# Patient Record
Sex: Female | Born: 1950 | Race: White | Hispanic: No | Marital: Married | Smoking: Never smoker
Health system: Southern US, Community
[De-identification: ages and names within clinical notes are randomized; demographics above are authoritative.]

## PROBLEM LIST (undated history)

## (undated) DIAGNOSIS — I10 Essential (primary) hypertension: Secondary | ICD-10-CM

## (undated) DIAGNOSIS — E7800 Pure hypercholesterolemia, unspecified: Secondary | ICD-10-CM

## (undated) DIAGNOSIS — G8929 Other chronic pain: Secondary | ICD-10-CM

## (undated) DIAGNOSIS — G629 Polyneuropathy, unspecified: Secondary | ICD-10-CM

## (undated) DIAGNOSIS — E119 Type 2 diabetes mellitus without complications: Secondary | ICD-10-CM

## (undated) HISTORY — PX: DILATION AND CURETTAGE OF UTERUS: SHX78

## (undated) HISTORY — PX: GALLBLADDER SURGERY: SHX652

## (undated) HISTORY — PX: CHOLECYSTECTOMY: SHX55

## (undated) HISTORY — PX: COLONOSCOPY: SHX174

---

## 2007-12-28 ENCOUNTER — Other Ambulatory Visit: Payer: Self-pay

## 2007-12-28 ENCOUNTER — Ambulatory Visit: Payer: Self-pay | Admitting: Ophthalmology

## 2009-07-18 ENCOUNTER — Ambulatory Visit: Payer: Self-pay | Admitting: Ophthalmology

## 2013-03-24 DIAGNOSIS — I152 Hypertension secondary to endocrine disorders: Secondary | ICD-10-CM | POA: Insufficient documentation

## 2013-03-24 DIAGNOSIS — E1169 Type 2 diabetes mellitus with other specified complication: Secondary | ICD-10-CM | POA: Insufficient documentation

## 2013-09-09 DIAGNOSIS — E1139 Type 2 diabetes mellitus with other diabetic ophthalmic complication: Secondary | ICD-10-CM | POA: Insufficient documentation

## 2020-02-23 ENCOUNTER — Other Ambulatory Visit: Payer: Self-pay | Admitting: Acute Care

## 2020-02-23 DIAGNOSIS — M549 Dorsalgia, unspecified: Secondary | ICD-10-CM

## 2020-03-08 ENCOUNTER — Other Ambulatory Visit: Payer: Self-pay

## 2020-03-08 ENCOUNTER — Ambulatory Visit
Admission: RE | Admit: 2020-03-08 | Discharge: 2020-03-08 | Disposition: A | Discharge status: Home / Self Care | Payer: Medicare Other | Source: Ambulatory Visit | Attending: Acute Care | Admitting: Acute Care

## 2020-03-08 DIAGNOSIS — M549 Dorsalgia, unspecified: Secondary | ICD-10-CM | POA: Diagnosis present

## 2020-05-29 DIAGNOSIS — G8929 Other chronic pain: Secondary | ICD-10-CM | POA: Insufficient documentation

## 2020-07-03 ENCOUNTER — Other Ambulatory Visit: Payer: Self-pay | Admitting: Acute Care

## 2020-07-03 DIAGNOSIS — M5416 Radiculopathy, lumbar region: Secondary | ICD-10-CM

## 2020-07-19 ENCOUNTER — Other Ambulatory Visit: Payer: Self-pay

## 2020-07-19 ENCOUNTER — Ambulatory Visit
Admission: RE | Admit: 2020-07-19 | Discharge: 2020-07-19 | Disposition: A | Discharge status: Home / Self Care | Payer: Medicare Other | Source: Ambulatory Visit | Attending: Acute Care | Admitting: Acute Care

## 2020-07-19 DIAGNOSIS — M5416 Radiculopathy, lumbar region: Secondary | ICD-10-CM | POA: Insufficient documentation

## 2020-12-27 NOTE — Patient Instructions (Incomplete)
VESTIBULAR AND BALANCE EVALUATION   HISTORY:  Subjective history of current problem: Description of dizziness: (vertigo, unsteadiness, lightheadedness, falling, general unsteadiness, whoozy, swimmy-headed sensation, aural fullness) Frequency:  Duration: Symptom nature: (motion provoked, positional, spontaneous, constant, variable, intermittent)   Provocative Factors: Easing Factors:  Progression of symptoms: (better, worse, no change since onset) History of similar episodes:  Falls (yes/no): Number of falls in past 6 months:   Prior Functional Level:   Auditory complaints (tinnitus, pain, drainage, hearing loss, aural fullness): Vision (diplopia, visual field loss, recent changes, last eye exam):  Red Flags: (dysarthria, dysphagia, drop attacks, bowel and bladder changes, recent weight loss/gain) Review of systems negative for red flags.     EXAMINATION  POSTURE:   NEUROLOGICAL SCREEN: (2+ unless otherwise noted.) N=normal  Ab=abnormal  Level Dermatome R L Myotome R L Reflex R L  C3 Anterior Neck N N Sidebend C2-3 N N Jaw CN V    C4 Top of Shoulder N N Shoulder Shrug C4 N N Hoffman's UMN    C5 Lateral Upper Arm N N Shoulder ABD C4-5 N N Biceps C5-6    C6 Lateral Arm/ Thumb N N Arm Flex/ Wrist Ext C5-6 N N Brachiorad. C5-6    C7 Middle Finger N N Arm Ext//Wrist Flex C6-7 N N Triceps C7    C8 4th & 5th Finger N N Flex/ Ext Carpi Ulnaris C8 N N Patellar (L3-4)    T1 Medial Arm N N Interossei T1 N N Gastrocnemius    L2 Medial thigh/groin N N Illiopsoas (L2-3) N N     L3 Lower thigh/med.knee N N Quadriceps (L3-4) N N     L4 Medial leg/lat thigh N N Tibialis Ant (L4-5) N N     L5 Lat. leg & dorsal foot N N EHL (L5) N N     S1 post/lat foot/thigh/leg N N Gastrocnemius (S1-2) N N     S2 Post./med. thigh & leg N N Hamstrings (L4-S3) N N       Cranial Nerves Visual acuity and visual fields are intact  Extraocular muscles are intact  Facial sensation is intact bilaterally   Facial strength is intact bilaterally  Hearing is normal as tested by gross conversation Palate elevates midline, normal phonation  Shoulder shrug strength is intact  Tongue protrudes midline    SOMATOSENSORY:         Sensation           Intact      Diminished         Absent  Light touch       COORDINATION: Finger to Nose: Normal Heel to Shin: Normal Pronator Drift: Negative Rapid Alternating Movements: Normal Finger to Thumb Opposition: Normal  MUSCULOSKELETAL SCREEN: Cervical Spine ROM: WFL and painless in all planes. No gross deficits identified   ROM:   MMT:   Functional Mobility:  Gait: Scanning of visual environment with gait is:    POSTURAL CONTROL TESTS:   Clinical Test of Sensory Interaction for Balance    (CTSIB):  CONDITION TIME STRATEGY SWAY  Eyes open, firm surface 30 seconds ankle   Eyes closed, firm surface 30 seconds ankle   Eyes open, foam surface 30 seconds ankle   Eyes closed, foam surface 30 seconds ankle     OCULOMOTOR / VESTIBULAR TESTING:  Oculomotor Exam- Room Light  Findings Comments  Ocular Alignment {normal/abnormal/not examined:14677}   Ocular ROM {normal/abnormal/not examined:14677}   Spontaneous Nystagmus {normal/abnormal/not examined:14677}   Gaze-Holding Nystagmus {normal/abnormal/not examined:14677}     End-Gaze Nystagmus {normal/abnormal/not examined:14677}   Vergence (normal 2-3") {normal/abnormal/not examined:14677}   Smooth Pursuit {normal/abnormal/not examined:14677}   Cross-Cover Test {normal/abnormal/not examined:14677}   Saccades {normal/abnormal/not examined:14677}   VOR Cancellation {normal/abnormal/not examined:14677}   Left Head Impulse {normal/abnormal/not examined:14677}   Right Head Impulse {normal/abnormal/not examined:14677}   Static Acuity {normal/abnormal/not examined:14677}   Dynamic Acuity {normal/abnormal/not examined:14677}     Oculomotor Exam- Fixation Suppressed  Findings Comments  Ocular  Alignment {normal/abnormal/not examined:14677}   Spontaneous Nystagmus {normal/abnormal/not examined:14677}   Gaze-Holding Nystagmus {normal/abnormal/not examined:14677}   End-Gaze Nystagmus {normal/abnormal/not examined:14677}   Head Shaking Nystagmus {normal/abnormal/not examined:14677}   Pressure-Induced Nystagmus {normal/abnormal/not examined:14677}   Hyperventilation Induced Nystagmus {normal/abnormal/not examined:14677}   Skull Vibration Induced Nystagmus {normal/abnormal/not examined:14677}     BPPV TESTS:  Symptoms Duration Intensity Nystagmus  L Dix-Hallpike None   None  R Dix-Hallpike None   None  L Head Roll None   None  R Head Roll None   None  L Sidelying Test      R Sidelying Test        FUNCTIONAL OUTCOME MEASURES   Results Comments  BERG /56 Fall risk, in need of intervention  DGI /24   FGA /30   TUG seconds   5TSTS seconds   6 Minute Walk Test    10 Meter Gait Speed Self-selected: s = m/s; Fastest: s = m/s Below normative values for full community ambulation  ABC Scale %   DHI /100     ASSESSMENT Clinical Impression: Pt is a pleasant year-old female/female referred for difficulty with baalance. PT examination reveals deficits . Pt presents with deficits in strength, gait and balance. Pt will benefit from skilled PT services to address deficits in balance and decrease risk for future falls.   Low (stable): no personal factors/comorbidities, 1-2 body systems/activity limitations/participation restrictions   Moderate (evolving): 1-2 personal factors/comorbidities, 3 or more body systems/activity limitations/participation restrictions   High (unstable): 3 or more personal factors/comorbidities, 4 or more body systems/activity limitations/participation restrictions    PLAN Next Visit: HEP:    Pt will be independent with HEP in order to improve strength and balance in order to decrease fall risk and improve function at home and work.   Pt will improve BERG by  at least 3 points in order to demonstrate clinically significant improvement in balance.    Pt will improve DGI by at least 3 points in order to demonstrate clinically significant improvement in balance and decreased risk for falls.  Pt will improve ABC by at least 13% in order to demonstrate clinically significant improvement in balance confidence.   Pt will decrease 5TSTS by at least 3 seconds in order to demonstrate clinically significant improvement in LE strength.  Pt will decrease TUG to below 14 seconds/decrease in order to demonstrate decreased fall risk.  Pt will decrease DHI score by at least 18 points in order to demonstrate clinically significant reduction in disability   Pt will increase 6MWT by at least 50m (164ft) in order to demonstrate clinically significant improvement in cardiopulmonary endurance and community ambulation                

## 2021-01-01 ENCOUNTER — Ambulatory Visit: Payer: Medicare Other

## 2021-01-08 ENCOUNTER — Ambulatory Visit: Payer: Medicare Other

## 2021-01-14 NOTE — Patient Instructions (Signed)
VESTIBULAR AND BALANCE EVALUATION   HISTORY:  Subjective history of current problem: Description of dizziness: (vertigo, unsteadiness, lightheadedness, falling, general unsteadiness, whoozy, swimmy-headed sensation, aural fullness) Frequency:  Duration: Symptom nature: (motion provoked, positional, spontaneous, constant, variable, intermittent)   Provocative Factors: Easing Factors:  Progression of symptoms: (better, worse, no change since onset) History of similar episodes:  Falls (yes/no): Number of falls in past 6 months:   Prior Functional Level:   Auditory complaints (tinnitus, pain, drainage, hearing loss, aural fullness): Vision (diplopia, visual field loss, recent changes, last eye exam):  Red Flags: (dysarthria, dysphagia, drop attacks, bowel and bladder changes, recent weight loss/gain) Review of systems negative for red flags.     EXAMINATION  POSTURE:   NEUROLOGICAL SCREEN: (2+ unless otherwise noted.) N=normal  Ab=abnormal  Level Dermatome R L Myotome R L Reflex R L  C3 Anterior Neck N N Sidebend C2-3 N N Jaw CN V    C4 Top of Shoulder N N Shoulder Shrug C4 N N Hoffman's UMN    C5 Lateral Upper Arm N N Shoulder ABD C4-5 N N Biceps C5-6    C6 Lateral Arm/ Thumb N N Arm Flex/ Wrist Ext C5-6 N N Brachiorad. C5-6    C7 Middle Finger N N Arm Ext//Wrist Flex C6-7 N N Triceps C7    C8 4th & 5th Finger N N Flex/ Ext Carpi Ulnaris C8 N N Patellar (L3-4)    T1 Medial Arm N N Interossei T1 N N Gastrocnemius    L2 Medial thigh/groin N N Illiopsoas (L2-3) N N     L3 Lower thigh/med.knee N N Quadriceps (L3-4) N N     L4 Medial leg/lat thigh N N Tibialis Ant (L4-5) N N     L5 Lat. leg & dorsal foot N N EHL (L5) N N     S1 post/lat foot/thigh/leg N N Gastrocnemius (S1-2) N N     S2 Post./med. thigh & leg N N Hamstrings (L4-S3) N N       Cranial Nerves Visual acuity and visual fields are intact  Extraocular muscles are intact  Facial sensation is intact bilaterally   Facial strength is intact bilaterally  Hearing is normal as tested by gross conversation Palate elevates midline, normal phonation  Shoulder shrug strength is intact  Tongue protrudes midline    SOMATOSENSORY:         Sensation           Intact      Diminished         Absent  Light touch       COORDINATION: Finger to Nose: Normal Heel to Shin: Normal Pronator Drift: Negative Rapid Alternating Movements: Normal Finger to Thumb Opposition: Normal  MUSCULOSKELETAL SCREEN: Cervical Spine ROM: WFL and painless in all planes. No gross deficits identified   ROM:   MMT:   Functional Mobility:  Gait: Scanning of visual environment with gait is:    POSTURAL CONTROL TESTS:   Clinical Test of Sensory Interaction for Balance    (CTSIB):  CONDITION TIME STRATEGY SWAY  Eyes open, firm surface 30 seconds ankle   Eyes closed, firm surface 30 seconds ankle   Eyes open, foam surface 30 seconds ankle   Eyes closed, foam surface 30 seconds ankle     OCULOMOTOR / VESTIBULAR TESTING:  Oculomotor Exam- Room Light  Findings Comments  Ocular Alignment {normal/abnormal/not examined:14677}   Ocular ROM {normal/abnormal/not examined:14677}   Spontaneous Nystagmus {normal/abnormal/not examined:14677}   Gaze-Holding Nystagmus {normal/abnormal/not examined:14677}     End-Gaze Nystagmus {normal/abnormal/not examined:14677}   Vergence (normal 2-3") {normal/abnormal/not examined:14677}   Smooth Pursuit {normal/abnormal/not examined:14677}   Cross-Cover Test {normal/abnormal/not examined:14677}   Saccades {normal/abnormal/not examined:14677}   VOR Cancellation {normal/abnormal/not examined:14677}   Left Head Impulse {normal/abnormal/not examined:14677}   Right Head Impulse {normal/abnormal/not examined:14677}   Static Acuity {normal/abnormal/not examined:14677}   Dynamic Acuity {normal/abnormal/not examined:14677}     Oculomotor Exam- Fixation Suppressed  Findings Comments  Ocular  Alignment {normal/abnormal/not examined:14677}   Spontaneous Nystagmus {normal/abnormal/not examined:14677}   Gaze-Holding Nystagmus {normal/abnormal/not examined:14677}   End-Gaze Nystagmus {normal/abnormal/not examined:14677}   Head Shaking Nystagmus {normal/abnormal/not examined:14677}   Pressure-Induced Nystagmus {normal/abnormal/not examined:14677}   Hyperventilation Induced Nystagmus {normal/abnormal/not examined:14677}   Skull Vibration Induced Nystagmus {normal/abnormal/not examined:14677}     BPPV TESTS:  Symptoms Duration Intensity Nystagmus  L Dix-Hallpike None   None  R Dix-Hallpike None   None  L Head Roll None   None  R Head Roll None   None  L Sidelying Test      R Sidelying Test        FUNCTIONAL OUTCOME MEASURES   Results Comments  BERG /56 Fall risk, in need of intervention  DGI /24   FGA /30   TUG seconds   5TSTS seconds   6 Minute Walk Test    10 Meter Gait Speed Self-selected: s = m/s; Fastest: s = m/s Below normative values for full community ambulation  ABC Scale %   DHI /100     ASSESSMENT Clinical Impression: Pt is a pleasant year-old female/female referred for difficulty with baalance. PT examination reveals deficits . Pt presents with deficits in strength, gait and balance. Pt will benefit from skilled PT services to address deficits in balance and decrease risk for future falls.   Low (stable): no personal factors/comorbidities, 1-2 body systems/activity limitations/participation restrictions   Moderate (evolving): 1-2 personal factors/comorbidities, 3 or more body systems/activity limitations/participation restrictions   High (unstable): 3 or more personal factors/comorbidities, 4 or more body systems/activity limitations/participation restrictions    PLAN Next Visit: HEP:    Pt will be independent with HEP in order to improve strength and balance in order to decrease fall risk and improve function at home and work.   Pt will improve BERG by  at least 3 points in order to demonstrate clinically significant improvement in balance.    Pt will improve DGI by at least 3 points in order to demonstrate clinically significant improvement in balance and decreased risk for falls.  Pt will improve ABC by at least 13% in order to demonstrate clinically significant improvement in balance confidence.   Pt will decrease 5TSTS by at least 3 seconds in order to demonstrate clinically significant improvement in LE strength.  Pt will decrease TUG to below 14 seconds/decrease in order to demonstrate decreased fall risk.  Pt will decrease DHI score by at least 18 points in order to demonstrate clinically significant reduction in disability   Pt will increase 6MWT by at least 50m (164ft) in order to demonstrate clinically significant improvement in cardiopulmonary endurance and community ambulation                

## 2021-01-15 ENCOUNTER — Ambulatory Visit: Payer: Medicare Other | Attending: Otolaryngology

## 2021-01-15 ENCOUNTER — Other Ambulatory Visit: Payer: Self-pay

## 2021-01-15 DIAGNOSIS — R2681 Unsteadiness on feet: Secondary | ICD-10-CM | POA: Insufficient documentation

## 2021-01-15 DIAGNOSIS — R42 Dizziness and giddiness: Secondary | ICD-10-CM | POA: Diagnosis present
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
## 2021-01-21 NOTE — Patient Instructions (Incomplete)
Repeat BPPV testing, fixation suppression testing, TUG, 5TSTS, 33m gait speed, BERG, DGI, mCTSIB initiate HEP as appropriate   Neuromuscular Re-education

## 2021-01-22 ENCOUNTER — Other Ambulatory Visit: Payer: Self-pay

## 2021-01-22 ENCOUNTER — Ambulatory Visit: Payer: Medicare Other | Attending: Otolaryngology

## 2021-01-22 DIAGNOSIS — R2681 Unsteadiness on feet: Secondary | ICD-10-CM | POA: Diagnosis present

## 2021-01-22 DIAGNOSIS — R42 Dizziness and giddiness: Secondary | ICD-10-CM | POA: Diagnosis not present

## 2021-01-22 DIAGNOSIS — H8112 Benign paroxysmal vertigo, left ear: Secondary | ICD-10-CM | POA: Diagnosis present
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
## 2021-01-29 ENCOUNTER — Other Ambulatory Visit: Payer: Self-pay

## 2021-01-29 ENCOUNTER — Ambulatory Visit: Payer: Medicare Other

## 2021-01-29 DIAGNOSIS — R42 Dizziness and giddiness: Secondary | ICD-10-CM | POA: Diagnosis not present

## 2021-01-29 DIAGNOSIS — H8112 Benign paroxysmal vertigo, left ear: Secondary | ICD-10-CM
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
## 2021-02-05 ENCOUNTER — Other Ambulatory Visit: Payer: Self-pay

## 2021-02-05 ENCOUNTER — Ambulatory Visit: Payer: Medicare Other

## 2021-02-05 DIAGNOSIS — R42 Dizziness and giddiness: Secondary | ICD-10-CM | POA: Diagnosis not present

## 2021-02-05 DIAGNOSIS — H8112 Benign paroxysmal vertigo, left ear: Secondary | ICD-10-CM
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
## 2021-02-11 NOTE — Patient Instructions (Incomplete)
TREATMENT   Canalith Repositioning Treatment On inverted mat table pt presents with initially negative L Dix-Hallpike for both vertigo and nystagmus. Decided to proceed with Epley maneuver. At the 2 minute mark in the L Dix-Hallpike position pt states that her vertigo is starting and she starts with upbeating L torsional nystagmus which lasts for approximately 20s with concurrent 4/10 severity vertigo. Pt left in L Hallpike position for 2 additional minutes (4 minutes total) and then completed Epley maneuver with 2 minute holds in the rest of the positions. After first maneuver repeated L Dix-Hallpike test which is positive for 15s of upbeating L torsional nystagmus with 2/10 vertigo and only 5s latency this time. Completed second Epley maneuver with patient with pt left in first position (L Dix-hallpike position) for 4 minutes followed by 2 minutes in each additional position. Due to thoracic pain deferred final maneuver and only performed two maneuvers today.

## 2021-02-12 ENCOUNTER — Other Ambulatory Visit: Payer: Self-pay

## 2021-02-12 ENCOUNTER — Ambulatory Visit: Payer: Medicare Other

## 2021-02-12 DIAGNOSIS — H8112 Benign paroxysmal vertigo, left ear: Secondary | ICD-10-CM

## 2021-02-12 DIAGNOSIS — R42 Dizziness and giddiness: Secondary | ICD-10-CM | POA: Diagnosis not present
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
## 2021-02-19 ENCOUNTER — Ambulatory Visit: Payer: Medicare Other

## 2021-02-19 ENCOUNTER — Other Ambulatory Visit: Payer: Self-pay

## 2021-02-19 DIAGNOSIS — R42 Dizziness and giddiness: Secondary | ICD-10-CM | POA: Diagnosis not present
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
## 2021-02-26 ENCOUNTER — Other Ambulatory Visit: Payer: Self-pay

## 2021-02-26 ENCOUNTER — Ambulatory Visit: Payer: Medicare Other | Attending: Otolaryngology

## 2021-02-26 DIAGNOSIS — R42 Dizziness and giddiness: Secondary | ICD-10-CM | POA: Insufficient documentation
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
## 2021-03-12 ENCOUNTER — Other Ambulatory Visit: Payer: Self-pay

## 2021-03-12 ENCOUNTER — Ambulatory Visit: Payer: Medicare Other

## 2021-03-12 DIAGNOSIS — R42 Dizziness and giddiness: Secondary | ICD-10-CM
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
## 2021-03-26 ENCOUNTER — Other Ambulatory Visit: Payer: Self-pay

## 2021-03-26 ENCOUNTER — Ambulatory Visit: Payer: Medicare Other | Attending: Otolaryngology

## 2021-03-26 DIAGNOSIS — R42 Dizziness and giddiness: Secondary | ICD-10-CM | POA: Diagnosis present
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
## 2021-04-02 ENCOUNTER — Other Ambulatory Visit: Payer: Self-pay

## 2021-04-02 ENCOUNTER — Ambulatory Visit: Payer: Medicare Other

## 2021-04-02 DIAGNOSIS — R42 Dizziness and giddiness: Secondary | ICD-10-CM | POA: Diagnosis not present
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
## 2021-04-09 ENCOUNTER — Ambulatory Visit: Payer: Medicare Other

## 2021-04-09 ENCOUNTER — Other Ambulatory Visit: Payer: Self-pay

## 2021-04-09 DIAGNOSIS — R42 Dizziness and giddiness: Secondary | ICD-10-CM | POA: Diagnosis not present
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
## 2021-04-16 ENCOUNTER — Ambulatory Visit: Payer: Medicare Other

## 2021-04-16 ENCOUNTER — Other Ambulatory Visit: Payer: Self-pay

## 2021-04-16 DIAGNOSIS — R42 Dizziness and giddiness: Secondary | ICD-10-CM
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
## 2021-04-23 ENCOUNTER — Ambulatory Visit: Payer: Medicare Other

## 2021-04-30 ENCOUNTER — Ambulatory Visit: Payer: Medicare Other

## 2021-05-07 ENCOUNTER — Ambulatory Visit: Payer: Medicare Other | Attending: Otolaryngology

## 2021-05-07 ENCOUNTER — Other Ambulatory Visit: Payer: Self-pay

## 2021-05-07 DIAGNOSIS — R42 Dizziness and giddiness: Secondary | ICD-10-CM | POA: Insufficient documentation
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
## 2021-12-09 ENCOUNTER — Other Ambulatory Visit: Payer: Self-pay

## 2021-12-09 ENCOUNTER — Ambulatory Visit
Admission: EM | Admit: 2021-12-09 | Discharge: 2021-12-09 | Disposition: A | Discharge status: Home / Self Care | Payer: Medicare Other | Attending: Emergency Medicine | Admitting: Emergency Medicine

## 2021-12-09 ENCOUNTER — Ambulatory Visit (INDEPENDENT_AMBULATORY_CARE_PROVIDER_SITE_OTHER): Payer: Medicare Other

## 2021-12-09 DIAGNOSIS — J019 Acute sinusitis, unspecified: Secondary | ICD-10-CM

## 2021-12-09 DIAGNOSIS — U071 COVID-19: Secondary | ICD-10-CM | POA: Diagnosis not present

## 2021-12-09 HISTORY — DX: Essential (primary) hypertension: I10

## 2021-12-09 HISTORY — DX: Polyneuropathy, unspecified: G62.9

## 2021-12-09 HISTORY — DX: Pure hypercholesterolemia, unspecified: E78.00

## 2021-12-09 HISTORY — DX: Type 2 diabetes mellitus without complications: E11.9

## 2021-12-09 HISTORY — DX: Other chronic pain: G89.29

## 2021-12-09 MED ORDER — BENZONATATE 200 MG PO CAPS: 200.0000 mg | ORAL_CAPSULE | Freq: Three times a day (TID) | ORAL | 0 refills | Status: AC | PRN

## 2021-12-09 MED ORDER — FLUTICASONE PROPIONATE 50 MCG/ACT NA SUSP: 2.0000 | Freq: Every day | NASAL | 0 refills | Status: AC

## 2021-12-09 MED ORDER — DOXYCYCLINE HYCLATE 100 MG PO CAPS
100.0000 mg | ORAL_CAPSULE | Freq: Two times a day (BID) | ORAL | 0 refills | Status: AC
Start: 2021-12-09 — End: 2021-12-19

## 2021-12-09 NOTE — Discharge Instructions (Addendum)
Flonase, saline nasal irrigation with a Lloyd Huger Med rinse and distilled water as often as you want, Mucinex for the nasal congestion, doxycycline will help with the sinus infection.  Tessalon for the cough.  Please follow-up with your doctor in 5 days if you are not getting any better, go to the ER if you get worse. ?

## 2021-12-09 NOTE — ED Triage Notes (Signed)
Patient presents to Urgent Care with complaints of nausea and headache since 03/14. Tested positive for COVID Sunday. Last know fever on 03/18.  ? ? ?

## 2021-12-09 NOTE — ED Provider Notes (Signed)
HPI ? ?SUBJECTIVE: ? ?Linda Tucker is a 70 y.o. female who presents with fevers Tmax 102, headaches, sinus pain and pressure, nasal congestion, rhinorrhea, cough, shortness of breath for the past week.  She tested positive for COVID yesterday.  No body aches, facial swelling, upper dental pain, sore throat, postnasal drip, loss of sense of smell or taste, dyspnea on exertion.  She has nausea, diarrhea and has had 1 episode of emesis yesterday.  She was able to keep lunch down today.  No abdominal pain.  She has been taking Tylenol, hot showers and eating sugar with improvement in her symptoms.  No aggravating factors.  No antibiotics in the past 3 months.  She took Tylenol within 6 hours of evaluation.  She is unable to sleep at night secondary to the cough.  She has a past medical history of diabetes, sugars have been fluctuating, but are not tremendously out-of-control.  She has a history of hypertension, hypercholesterolemia, peripheral neuropathy.  No history of pulmonary disease.  PCP: [facility]. ? ? ?Past Medical History:  ?Diagnosis Date  ? Chronic back pain   ? Chronic hip pain   ? High cholesterol   ? Hypertension   ? Neuropathy   ? Type 2 diabetes mellitus ([place])   ? ? ?Past Surgical History:  ?Procedure Laterality Date  ? CESAREAN SECTION    ? COLONOSCOPY    ? DILATION AND CURETTAGE OF UTERUS    ? GALLBLADDER SURGERY    ? ? ?History reviewed. No pertinent family history. ? ?Social History  ? ?Tobacco Use  ? Smoking status: Never  ? Smokeless tobacco: Never  ?Vaping Use  ? Vaping Use: Never used  ?Substance Use Topics  ? Alcohol use: Never  ? Drug use: Never  ? ? ?No current facility-administered medications for this encounter. ? ?Current Outpatient Medications:  ?  benzonatate (TESSALON) 200 MG capsule, Take 1 capsule (200 mg total) by mouth 3 (three) times daily as needed for cough., Disp: 30 capsule, Rfl: 0 ?  doxycycline (VIBRAMYCIN) 100 MG capsule, Take 1 capsule (100 mg total) by mouth  2 (two) times daily for 10 days., Disp: 20 capsule, Rfl: 0 ?  fluticasone (FLONASE) 50 MCG/ACT nasal spray, Place 2 sprays into both nostrils daily., Disp: 16 g, Rfl: 0 ?  co-enzyme Q-10 30 MG capsule, Take by mouth., Disp: , Rfl:  ?  gabapentin (NEURONTIN) 300 MG capsule, Take 900 mg by mouth 2 (two) times daily., Disp: , Rfl:  ?  hydrochlorothiazide (HYDRODIURIL) 25 MG tablet, Take 25 mg by mouth daily., Disp: , Rfl:  ?  insulin glargine (LANTUS) 100 UNIT/ML injection, Inject 60 Units into the skin daily., Disp: , Rfl:  ?  insulin regular (NOVOLIN R) 100 units/mL injection, Inject into the skin 3 (three) times daily before meals., Disp: , Rfl:  ?  lisinopril (ZESTRIL) 20 MG tablet, Take 20 mg by mouth daily., Disp: , Rfl:  ?  Melatonin 5 MG CAPS, Take by mouth., Disp: , Rfl:  ?  metFORMIN (GLUCOPHAGE) 850 MG tablet, Take 850 mg by mouth 2 (two) times daily with a meal., Disp: , Rfl:  ?  Multiple Vitamin (MULTI-VITAMIN) tablet, Take 1 tablet by mouth daily., Disp: , Rfl:  ?  simvastatin (ZOCOR) 40 MG tablet, Take 40 mg by mouth daily., Disp: , Rfl:  ?  Vitamin E 45 MG (100 UNIT) CAPS, Take by mouth., Disp: , Rfl:  ? ?Allergies  ?Allergen Reactions  ? Benadryl Allergy [Diphenhydramine  Hcl]   ? Penicillins   ? Sulfa Antibiotics   ? Sulfamethoxazole   ? Sulfadiazine Rash  ? Trimethoprim Rash  ? ? ? ?ROS ? ?As noted in HPI.  ? ?Physical Exam ? ?BP (!) 148/59 (BP Location: Left Arm)   Pulse 70   Temp 97.8 ?F (36.6 ?C) (Oral)   SpO2 100%  ? ?Constitutional: Well developed, well nourished, no acute distress.  Coughing. ?Eyes:  EOMI, conjunctiva normal bilaterally ?HENT: Normocephalic, atraumatic,mucus membranes moist.  Positive nasal congestion.  Normal turbinates.  No maxillary, frontal sinus tenderness.  No obvious postnasal drip. ?Respiratory: Normal inspiratory effort, lungs clear bilaterally, good air movement ?Cardiovascular: Normal rate, regular rhythm, no murmurs, rubs, gallop ?GI: nondistended ?skin: No rash,  skin intact ?Musculoskeletal: no deformities ?Neurologic: Alert & oriented x 3, no focal neuro deficits ?Psychiatric: Speech and behavior appropriate ? ? ?ED Course ? ? ?Medications - No data to display ? ?Orders Placed This Encounter  ?Procedures  ? DG Chest 2 View  ?  Standing Status:   Standing  ?  Number of Occurrences:   1  ?  Order Specific Question:   Reason for Exam (SYMPTOM  OR DIAGNOSIS REQUIRED)  ?  Answer:   Sick for 1 week with cough.  COVID-positive yesterday.  Rule out pneumonia  ? ? ?No results found for this or any previous visit (from the past 24 hour(s)). ?DG Chest 2 View ? ?Result Date: 12/09/2021 ?CLINICAL DATA:  Cough, COVID positive EXAM: CHEST - 2 VIEW COMPARISON:  None. FINDINGS: Cardiac size is within normal limits. Thoracic aorta is tortuous. Lung fields are clear of any infiltrates or pulmonary edema. There is no pleural effusion or pneumothorax. IMPRESSION: No active cardiopulmonary disease. Electronically Signed   By: Elmer Picker M.D.   On: 12/09/2021 14:30   ? ?ED Clinical Impression ? ?1. COVID-19 virus infection   ?2. Acute non-recurrent sinusitis, unspecified location   ?  ? ?ED Assessment/Plan ? ?Reviewed imaging independently.  No infiltrate, pulmonary edema.  See radiology report for full details. ? ?Obtaining chest x-ray due to age and per family request.  They are concerned that she may have a secondary or aspiration pneumonia. ? ?Chest x-ray negative for pneumonia.  Presentation most concerning for secondary sinusitis from COVID infection.  Sending home with Flonase, saline nasal irrigation, Mucinex, Tessalon, doxycycline for sinusitis.  She has a penicillin allergy.  We discussed Promethazine DM, but family declined as she has had a history of recent fall.  I agree with this. ? ?Discussed imaging, MDM, treatment plan, and plan for follow-up with patient. patient agrees with plan.  ? ?Meds ordered this encounter  ?Medications  ? fluticasone (FLONASE) 50 MCG/ACT nasal  spray  ?  Sig: Place 2 sprays into both nostrils daily.  ?  Dispense:  16 g  ?  Refill:  0  ? benzonatate (TESSALON) 200 MG capsule  ?  Sig: Take 1 capsule (200 mg total) by mouth 3 (three) times daily as needed for cough.  ?  Dispense:  30 capsule  ?  Refill:  0  ? doxycycline (VIBRAMYCIN) 100 MG capsule  ?  Sig: Take 1 capsule (100 mg total) by mouth 2 (two) times daily for 10 days.  ?  Dispense:  20 capsule  ?  Refill:  0  ? ? ? ? ?*This clinic note was created using [employer]. Therefore, there may be occasional mistakes despite careful proofreading. ? ?? ? ?  ?Melynda Ripple, MD ?  12/09/21 1451 ? ?
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
## 2022-01-15 ENCOUNTER — Ambulatory Visit: Payer: Medicare Other | Attending: Family Medicine

## 2022-01-15 DIAGNOSIS — R42 Dizziness and giddiness: Secondary | ICD-10-CM | POA: Diagnosis present

## 2022-01-21 ENCOUNTER — Ambulatory Visit: Payer: Medicare Other | Attending: Family Medicine

## 2022-01-21 DIAGNOSIS — R2681 Unsteadiness on feet: Secondary | ICD-10-CM | POA: Insufficient documentation

## 2022-01-21 DIAGNOSIS — H8112 Benign paroxysmal vertigo, left ear: Secondary | ICD-10-CM | POA: Insufficient documentation

## 2022-01-21 DIAGNOSIS — R42 Dizziness and giddiness: Secondary | ICD-10-CM | POA: Diagnosis present
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
## 2022-01-28 ENCOUNTER — Ambulatory Visit: Payer: Medicare Other

## 2022-01-28 DIAGNOSIS — R2681 Unsteadiness on feet: Secondary | ICD-10-CM

## 2022-01-28 DIAGNOSIS — R42 Dizziness and giddiness: Secondary | ICD-10-CM | POA: Diagnosis not present

## 2022-01-28 DIAGNOSIS — H8112 Benign paroxysmal vertigo, left ear: Secondary | ICD-10-CM
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
## 2022-02-04 ENCOUNTER — Ambulatory Visit: Payer: Medicare Other

## 2022-02-04 DIAGNOSIS — H8112 Benign paroxysmal vertigo, left ear: Secondary | ICD-10-CM

## 2022-02-04 DIAGNOSIS — R42 Dizziness and giddiness: Secondary | ICD-10-CM
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
## 2022-03-11 ENCOUNTER — Ambulatory Visit: Payer: Medicare Other | Attending: Family Medicine

## 2022-03-11 DIAGNOSIS — R42 Dizziness and giddiness: Secondary | ICD-10-CM | POA: Insufficient documentation
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
## 2022-03-22 HISTORY — PX: DENTAL RESTORATION/EXTRACTION WITH X-RAY: SHX5796

## 2022-06-01 ENCOUNTER — Ambulatory Visit
Admission: EM | Admit: 2022-06-01 | Discharge: 2022-06-01 | Disposition: A | Discharge status: Home / Self Care | Payer: Medicare Other | Attending: Family Medicine | Admitting: Family Medicine

## 2022-06-01 DIAGNOSIS — E871 Hypo-osmolality and hyponatremia: Secondary | ICD-10-CM

## 2022-06-01 DIAGNOSIS — D72829 Elevated white blood cell count, unspecified: Secondary | ICD-10-CM

## 2022-06-01 DIAGNOSIS — K1379 Other lesions of oral mucosa: Secondary | ICD-10-CM | POA: Diagnosis present

## 2022-06-01 LAB — BASIC METABOLIC PANEL
Anion gap: 10 (ref 5–15)
BUN: 26 mg/dL — ABNORMAL HIGH (ref 8–23)
CO2: 24 mmol/L (ref 22–32)
Calcium: 9.3 mg/dL (ref 8.9–10.3)
Chloride: 88 mmol/L — ABNORMAL LOW (ref 98–111)
Creatinine, Ser: 0.87 mg/dL (ref 0.44–1.00)
GFR, Estimated: >60 mL/min (ref >60)
Glucose, Bld: 181 mg/dL — ABNORMAL HIGH (ref 70–99)
Potassium: 4.1 mmol/L (ref 3.5–5.1)
Sodium: 122 mmol/L — ABNORMAL LOW (ref 135–145)

## 2022-06-01 LAB — CBC WITH DIFFERENTIAL/PLATELET
Abs Immature Granulocytes: 0.06 10*3/uL (ref 0.00–0.07)
Basophils Absolute: 0.1 10*3/uL (ref 0.0–0.1)
Basophils Relative: 1 %
Eosinophils Absolute: 0.1 10*3/uL (ref 0.0–0.5)
Eosinophils Relative: 1 %
HCT: 34.2 % — ABNORMAL LOW (ref 36.0–46.0)
Hemoglobin: 12.2 g/dL (ref 12.0–15.0)
Immature Granulocytes: 0 %
Lymphocytes Relative: 27 %
Lymphs Abs: 4.2 10*3/uL — ABNORMAL HIGH (ref 0.7–4.0)
MCH: 31.8 pg (ref 26.0–34.0)
MCHC: 35.7 g/dL (ref 30.0–36.0)
MCV: 89.1 fL (ref 80.0–100.0)
Monocytes Absolute: 1.3 10*3/uL — ABNORMAL HIGH (ref 0.1–1.0)
Monocytes Relative: 9 %
Neutro Abs: 9.6 10*3/uL — ABNORMAL HIGH (ref 1.7–7.7)
Neutrophils Relative %: 62 %
Platelets: 408 10*3/uL — ABNORMAL HIGH (ref 150–400)
RBC: 3.84 MIL/uL — ABNORMAL LOW (ref 3.87–5.11)
RDW: 12.4 % (ref 11.5–15.5)
WBC: 15.4 10*3/uL — ABNORMAL HIGH (ref 4.0–10.5)
nRBC: 0 % (ref 0.0–0.2)

## 2022-06-01 NOTE — ED Provider Notes (Addendum)
[facility]    CSN: [id number] Arrival date & time: 06/01/22  1336      History   Chief Complaint Chief Complaint  Patient presents with   Dental Pain    Mouth pain   Headache    HPI Linda Tucker is a 71 y.o. female.   HPI  Harneet brought in by her daughter for worsening mouth pain for the past week.  Daughter states that patient had her teeth removed 3 to 4 weeks ago and she has been trying to get her mom to used to wearing her dentures.  Patient reports she was able to eat oatmeal and drink some fluids today.  She has intermittent mouth pain but has since been more constant.  She has tried some ibuprofen and gabapentin around 11 AM.  She has a sore throat and pain with swallowing.  Daughter reports a fever at home for the past 2 days.  Patient did not get any sleep last night.  On Tuesday, she was seen in urgent care and had a negative COVID and strep test.  Patient feels like her mouth is face is swollen and her daughter agrees.  She is having a headache and dizziness.  She has not had any cough, nausea, vomiting, shortness of breath or chest discomfort.  She feels like she is just worn down.  She does note some drainage in the back of her throat.     Past Medical History:  Diagnosis Date   Chronic back pain    Chronic hip pain    High cholesterol    Hypertension    Neuropathy    Type 2 diabetes mellitus (HCC)     There are no problems to display for this patient.   Past Surgical History:  Procedure Laterality Date   CESAREAN SECTION     CHOLECYSTECTOMY     COLONOSCOPY     DENTAL RESTORATION/EXTRACTION WITH X-RAY Bilateral 03/2022   DILATION AND CURETTAGE OF UTERUS     GALLBLADDER SURGERY      OB History   No obstetric history on file.      Home Medications    Prior to Admission medications   Medication Sig Start Date End Date Taking? Authorizing Provider  benzonatate (TESSALON) 200 MG capsule Take 1 capsule (200 mg total) by mouth 3  (three) times daily as needed for cough. 12/09/21   Domenick Gong, MD  co-enzyme Q-10 30 MG capsule Take by mouth.    [provider]  fluticasone (FLONASE) 50 MCG/ACT nasal spray Place 2 sprays into both nostrils daily. 12/09/21   Domenick Gong, MD  gabapentin (NEURONTIN) 300 MG capsule Take 900 mg by mouth 2 (two) times daily. 10/30/21   [provider]  hydrochlorothiazide (HYDRODIURIL) 25 MG tablet Take 25 mg by mouth daily.    [provider]  insulin glargine (LANTUS) 100 UNIT/ML injection Inject 60 Units into the skin daily.    [provider]  insulin regular (NOVOLIN R) 100 units/mL injection Inject into the skin 3 (three) times daily before meals.    [provider]  lisinopril (ZESTRIL) 20 MG tablet Take 20 mg by mouth daily.    [provider]  Melatonin 5 MG CAPS Take by mouth.    [provider]  metFORMIN (GLUCOPHAGE) 850 MG tablet Take 850 mg by mouth 2 (two) times daily with a meal.    [provider]  Multiple Vitamin (MULTI-VITAMIN) tablet Take 1 tablet by mouth daily.  [provider]  simvastatin (ZOCOR) 40 MG tablet Take 40 mg by mouth daily.    [provider]  Vitamin E 45 MG (100 UNIT) CAPS Take by mouth.    [provider]    Family History History reviewed. No pertinent family history.  Social History Social History   Tobacco Use   Smoking status: Never   Smokeless tobacco: Never  Vaping Use   Vaping Use: Never used  Substance Use Topics   Alcohol use: Never   Drug use: Never     Allergies   Benadryl allergy [diphenhydramine hcl], Penicillins, Sulfa antibiotics, Sulfamethoxazole, Sulfadiazine, and Trimethoprim   Review of Systems Review of Systems : negative unless otherwise stated in HPI.      Physical Exam Triage Vital Signs ED Triage Vitals  Enc Vitals Group     BP 06/01/22 1348 (!) 156/72     Pulse Rate 06/01/22 1348 81     Resp --       Temp 06/01/22 1348 99.3 F (37.4 C)     Temp Source 06/01/22 1348 Oral     SpO2 06/01/22 1348 100 %     Weight 06/01/22 1347 214 lb (97.1 kg)     Height 06/01/22 1347 5\' 4"  (1.626 m)     Head Circumference --      Peak Flow --      Pain Score 06/01/22 1346 9     Pain Loc --      Pain Edu? --      Excl. in GC? --    No data found.  Updated Vital Signs BP (!) 156/72 (BP Location: Left Arm)   Pulse 81   Temp 99.3 F (37.4 C) (Oral)   Ht 5\' 4"  (1.626 m)   Wt 97.1 kg   SpO2 100%   BMI 36.73 kg/m   Visual Acuity Right Eye Distance:   Left Eye Distance:   Bilateral Distance:    Right Eye Near:   Left Eye Near:    Bilateral Near:     Physical Exam  GEN: Drowsy but easily arousable, chronically ill appearing female, in no acute distress    HENT: Edentulous, mucus membranes moist, gums tender to palpation, unable to see tonsils due to enlarged tongue, exudates on the gumline, see picture under media EYES:   pupils equal and reactive, EOM intact NECK:  supple, normal ROM RESP:  clear to auscultation bilaterally, no increased work of breathing  CVS:   regular rate and rhythm EXT:  Seated in wheelchair NEURO:  alert and oriented and speech at baseline, no facial droop Skin:   warm and dry    UC Treatments / Results  Labs (all labs ordered are listed, but only abnormal results are displayed) Labs Reviewed  CBC WITH DIFFERENTIAL/PLATELET - Abnormal; Notable for the following components:      Result Value   WBC 15.4 (*)    RBC 3.84 (*)    HCT 34.2 (*)    Platelets 408 (*)    Neutro Abs 9.6 (*)    Lymphs Abs 4.2 (*)    Monocytes Absolute 1.3 (*)    All other components within normal limits  BASIC METABOLIC PANEL - Abnormal; Notable for the following components:   Sodium 122 (*)    Chloride 88 (*)    Glucose, Bld 181 (*)    BUN 26 (*)    All other components within normal limits    EKG   Radiology No results found.  Procedures Procedures (including  critical care time)  Medications Ordered in UC Medications - No data to display  Initial Impression / Assessment and Plan / UC Course  I have reviewed the triage vital signs and the nursing notes.  Pertinent labs & imaging results that were available during my care of the patient were reviewed by me and considered in my medical decision making (see chart for details).     Patient is a 71 year old female with history of type 2 diabetes, hypertension, hyperlipidemia who recently had all of her teeth removed who presents for worsening mouth pain for the past week.  On chart review she was seen on 05/27/2022 and had a negative COVID and strep test.  Using her continuous glucose monitor she checked her blood sugar during our exam and it was 196.  Her most recent A1c was 4 months ago, 6.2.  Daughter reports her mom's blood sugars are rarely that high.  Patient is drowsy during our conversation but is easily arousable.  I am not sure if this is due to her current acute illness or due to the fact that she has not gotten any sleep and took gabapentin a couple hours prior to arrival.  CBC concerning for frank leukocytosis (WBC 15.4) with left shift and thrombocytosis.  On CMP, there is a profound hypochloremic hyponatremia with a sodium of 122 (baseline ~135).  Given reports of fever with this frank leukocytosis and profound hyponatremia.   Recommended patient go to the ED for further evaluation and possible imaging that is not available here.   Daughter and patient agree with this plan. Pt requests to go by private vehicle to [facility] as that is where her mom sees most of her doctors.  [facility] charge nurse called and will expect patient's arrival.  Discussed MDM, treatment plan and plan for follow-up with patient/parent who agrees with plan.    Final Clinical Impressions(s) / UC Diagnoses   Final diagnoses:  Leukocytosis, unspecified type  Mouth pain  Hyponatremia     Discharge  Instructions      You have been advised to follow up immediately in the emergency department for concerning signs or symptoms as discussed during your visit. If you declined EMS transport, please have a family member take you directly to the ED at this time. Do not delay.   Based on concerns about condition, if you do not follow up in the ED, you may risk poor outcomes including worsening of condition, delayed treatment and potentially life threatening issues. If you have declined to go to the ED at this time, you should call your PCP immediately to set up a follow up appointment.   Go to ED for red flag symptoms, including; fevers you cannot reduce with Tylenol/Motrin, severe headaches, vision changes, numbness/weakness in part of the body, lethargy, confusion, intractable vomiting, severe dehydration, chest pain, breathing difficulty, severe persistent abdominal or pelvic pain, signs of severe infection (increased redness, swelling of an area), feeling faint or passing out, dizziness, etc. You should especially go to the ED for sudden acute worsening of condition if you do not elect to go at this time.       ED Prescriptions   None    PDMP not reviewed this encounter.       Katha Cabal, DO 06/01/22 2232    Katha Cabal, DO 06/01/22 2235

## 2022-06-01 NOTE — ED Triage Notes (Signed)
Patient reports that she had a headache, jaw numbness in the AM and mouth pain -- symptoms started last Saturday. Patient was seen by urgent care Tuesday and was tested for COVID and strep and they both were negative.

## 2022-06-01 NOTE — ED Notes (Signed)
Patient is being discharged from the Urgent Care and sent to the [facility] Emergency Department via private vehicle . Per Dr. Rachael Darby, patient is in need of higher level of care due to elevated WBC. Patient is aware and verbalizes understanding of plan of care.  Vitals:   06/01/22 1348  BP: (!) 156/72  Pulse: 81  Temp: 99.3 F (37.4 C)  SpO2: 100%

## 2022-06-01 NOTE — Discharge Instructions (Addendum)
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
## 2023-07-10 DIAGNOSIS — I480 Paroxysmal atrial fibrillation: Secondary | ICD-10-CM | POA: Insufficient documentation

## 2023-09-25 DIAGNOSIS — I2510 Atherosclerotic heart disease of native coronary artery without angina pectoris: Secondary | ICD-10-CM | POA: Insufficient documentation

## 2023-10-22 ENCOUNTER — Other Ambulatory Visit: Payer: Self-pay | Admitting: Internal Medicine

## 2023-10-22 DIAGNOSIS — Z13820 Encounter for screening for osteoporosis: Secondary | ICD-10-CM

## 2023-12-10 DIAGNOSIS — I5032 Chronic diastolic (congestive) heart failure: Secondary | ICD-10-CM | POA: Insufficient documentation

## 2024-03-01 ENCOUNTER — Encounter: Attending: Cardiology

## 2024-03-01 DIAGNOSIS — Z951 Presence of aortocoronary bypass graft: Secondary | ICD-10-CM | POA: Insufficient documentation

## 2024-03-01 DIAGNOSIS — Z48812 Encounter for surgical aftercare following surgery on the circulatory system: Secondary | ICD-10-CM | POA: Insufficient documentation

## 2024-03-01 NOTE — Progress Notes (Signed)
 Initial phone call completed. Diagnosis can be found in [facility] 12/25/2023. EP Orientation scheduled for 03/02/2024 @ 10:00.

## 2024-03-02 ENCOUNTER — Encounter

## 2024-03-02 VITALS — Ht 63.4 in | Wt 202.8 lb

## 2024-03-02 DIAGNOSIS — Z951 Presence of aortocoronary bypass graft: Secondary | ICD-10-CM | POA: Diagnosis present

## 2024-03-02 DIAGNOSIS — Z48812 Encounter for surgical aftercare following surgery on the circulatory system: Secondary | ICD-10-CM | POA: Diagnosis not present

## 2024-03-02 NOTE — Patient Instructions (Signed)
 Patient Instructions  Patient Details  Name: Linda Tucker MRN: [id number] Date of Birth: Feb 13, 1951 Referring Provider:  Valery Gaucher, MD  Below are your personal goals for exercise, nutrition, and risk factors. Our goal is to help you stay on track towards obtaining and maintaining these goals. We will be discussing your progress on these goals with you throughout the program.  Initial Exercise Prescription:  Initial Exercise Prescription - 03/02/24 1300       Date of Initial Exercise RX and Referring Provider   Date 03/02/24    Referring Provider Dr. Valery Gaucher      Oxygen   Maintain Oxygen Saturation 88% or higher      Recumbant Bike   Level 1    RPM 50    Watts 25    Minutes 15    METs 2      NuStep   Level 1    SPM 80    Minutes 15    METs 2      Biostep-RELP   Level 1    SPM 50    Minutes 15    METs 2      Track   Laps 16    Minutes 15    METs 2      Intensity   THRR 40-80% of Max Heartrate 107-133    Ratings of Perceived Exertion 11-13    Perceived Dyspnea 0-4      Progression   Progression Continue to progress workloads to maintain intensity without signs/symptoms of physical distress.      Resistance Training   Training Prescription Yes    Weight 2lb    Reps 10-15             Exercise Goals: Frequency: Be able to perform aerobic exercise two to three times per week in program working toward 2-5 days per week of home exercise.  Intensity: Work with a perceived exertion of 11 (fairly light) - 15 (hard) while following your exercise prescription.  We will make changes to your prescription with you as you progress through the program.   Duration: Be able to do 30 to 45 minutes of continuous aerobic exercise in addition to a 5 minute warm-up and a 5 minute cool-down routine.   Nutrition Goals: Your personal nutrition goals will be established when you do your nutrition analysis with the dietician.  The following are general nutrition  guidelines to follow: Cholesterol < 200mg /day Sodium < 1500mg /day Fiber: Women over 50 yrs - 21 grams per day  Personal Goals:  Personal Goals and Risk Factors at Admission - 03/01/24 1430       Core Components/Risk Factors/Patient Goals on Admission    Weight Management Yes    Intervention Weight Management/Obesity: Establish reasonable short term and long term weight goals.;Weight Management: Provide education and appropriate resources to help participant work on and attain dietary goals.;Obesity: Provide education and appropriate resources to help participant work on and attain dietary goals.    Expected Outcomes Short Term: Continue to assess and modify interventions until short term weight is achieved;Long Term: Adherence to nutrition and physical activity/exercise program aimed toward attainment of established weight goal;Weight Loss: Understanding of general recommendations for a balanced deficit meal plan, which promotes 1-2 lb weight loss per week and includes a negative energy balance of [phone] kcal/d;Understanding recommendations for meals to include 15-35% energy as protein, 25-35% energy from fat, 35-60% energy from carbohydrates, less than 200mg  of dietary cholesterol, 20-35 gm of total fiber daily;Understanding  of distribution of calorie intake throughout the day with the consumption of 4-5 meals/snacks    Diabetes Yes    Intervention Provide education about signs/symptoms and action to take for hypo/hyperglycemia.;Provide education about proper nutrition, including hydration, and aerobic/resistive exercise prescription along with prescribed medications to achieve blood glucose in normal ranges: Fasting glucose 65-99 mg/dL    Expected Outcomes Short Term: Participant verbalizes understanding of the signs/symptoms and immediate care of hyper/hypoglycemia, proper foot care and importance of medication, aerobic/resistive exercise and nutrition plan for blood glucose control.;Long Term:  Attainment of HbA1C < 7%.    Hypertension Yes    Intervention Provide education on lifestyle modifcations including regular physical activity/exercise, weight management, moderate sodium restriction and increased consumption of fresh fruit, vegetables, and low fat dairy, alcohol moderation, and smoking cessation.;Monitor prescription use compliance.    Expected Outcomes Short Term: Continued assessment and intervention until BP is < 140/82mm HG in hypertensive participants. < 130/36mm HG in hypertensive participants with diabetes, heart failure or chronic kidney disease.;Long Term: Maintenance of blood pressure at goal levels.    Lipids Yes    Intervention Provide education and support for participant on nutrition & aerobic/resistive exercise along with prescribed medications to achieve LDL 70mg , HDL >40mg .    Expected Outcomes Long Term: Cholesterol controlled with medications as prescribed, with individualized exercise RX and with personalized nutrition plan. Value goals: LDL < 70mg , HDL > 40 mg.;Short Term: Participant states understanding of desired cholesterol values and is compliant with medications prescribed. Participant is following exercise prescription and nutrition guidelines.             Exercise Goals and Review:  Exercise Goals     Row Name 03/02/24 1309             Exercise Goals   Increase Physical Activity Yes       Intervention Provide advice, education, support and counseling about physical activity/exercise needs.;Develop an individualized exercise prescription for aerobic and resistive training based on initial evaluation findings, risk stratification, comorbidities and participant's personal goals.       Expected Outcomes Short Term: Attend rehab on a regular basis to increase amount of physical activity.;Long Term: Add in home exercise to make exercise part of routine and to increase amount of physical activity.;Long Term: Exercising regularly at least 3-5 days a  week.       Increase Strength and Stamina Yes       Intervention Provide advice, education, support and counseling about physical activity/exercise needs.;Develop an individualized exercise prescription for aerobic and resistive training based on initial evaluation findings, risk stratification, comorbidities and participant's personal goals.       Expected Outcomes Long Term: Improve cardiorespiratory fitness, muscular endurance and strength as measured by increased METs and functional capacity ( );Short Term: Perform resistance training exercises routinely during rehab and add in resistance training at home;Short Term: Increase workloads from initial exercise prescription for resistance, speed, and METs.       Able to understand and use rate of perceived exertion (RPE) scale Yes       Intervention Provide education and explanation on how to use RPE scale       Expected Outcomes Short Term: Able to use RPE daily in rehab to express subjective intensity level;Long Term:  Able to use RPE to guide intensity level when exercising independently       Able to understand and use Dyspnea scale Yes       Intervention Provide education and explanation on how  to use Dyspnea scale       Expected Outcomes Short Term: Able to use Dyspnea scale daily in rehab to express subjective sense of shortness of breath during exertion;Long Term: Able to use Dyspnea scale to guide intensity level when exercising independently       Knowledge and understanding of Target Heart Rate Range (THRR) Yes       Intervention Provide education and explanation of THRR including how the numbers were predicted and where they are located for reference       Expected Outcomes Short Term: Able to state/look up THRR;Short Term: Able to use daily as guideline for intensity in rehab;Long Term: Able to use THRR to govern intensity when exercising independently       Able to check pulse independently Yes       Intervention Provide education and  demonstration on how to check pulse in carotid and radial arteries.;Review the importance of being able to check your own pulse for safety during independent exercise       Expected Outcomes Short Term: Able to explain why pulse checking is important during independent exercise;Long Term: Able to check pulse independently and accurately       Understanding of Exercise Prescription Yes       Intervention Provide education, explanation, and written materials on patient's individual exercise prescription       Expected Outcomes Long Term: Able to explain home exercise prescription to exercise independently;Short Term: Able to explain program exercise prescription                Copy of goals given to participant.

## 2024-03-02 NOTE — Progress Notes (Signed)
 Cardiac Individual Treatment Plan  Patient Details  Name: Linda Tucker MRN: [id number] Date of Birth: 04/07/1951 Referring Provider:   Flowsheet Row Cardiac Rehab from 03/02/2024 in [facility] Cardiac and Pulmonary Rehab  Referring Provider Dr. Valery Gaucher       Initial Encounter Date:  Flowsheet Row Cardiac Rehab from 03/02/2024 in [facility] Cardiac and Pulmonary Rehab  Date 03/02/24       Visit Diagnosis: S/P CABG x 4  Patient's Home Medications on Admission:  Current Outpatient Medications:    apixaban (ELIQUIS) 5 MG TABS tablet, Take 5 mg by mouth 2 (two) times daily., Disp: , Rfl:    atorvastatin (LIPITOR) 40 MG tablet, Take 40 mg by mouth daily., Disp: , Rfl:    benzonatate  (TESSALON ) 200 MG capsule, Take 1 capsule (200 mg total) by mouth 3 (three) times daily as needed for cough. (Patient not taking: Reported on 03/01/2024), Disp: 30 capsule, Rfl: 0   Chromium 200 MCG CAPS, Take 200 mcg by mouth daily., Disp: , Rfl:    clopidogrel (PLAVIX) 75 MG tablet, Take 75 mg by mouth daily., Disp: , Rfl:    co-enzyme Q-10 30 MG capsule, Take by mouth. (Patient not taking: Reported on 03/01/2024), Disp: , Rfl:    cyanocobalamin (VITAMIN B12) 1000 MCG tablet, Take 1,000 mcg by mouth daily., Disp: , Rfl:    diltiazem (CARDIZEM CD) 180 MG 24 hr capsule, Take 120 mg by mouth daily., Disp: , Rfl:    fexofenadine (ALLEGRA ODT) 30 MG disintegrating tablet, Take 30 mg by mouth daily., Disp: , Rfl:    fluticasone  (FLONASE ) 50 MCG/ACT nasal spray, Place 2 sprays into both nostrils daily. (Patient not taking: Reported on 03/01/2024), Disp: 16 g, Rfl: 0   gabapentin (NEURONTIN) 300 MG capsule, Take 900 mg by mouth 2 (two) times daily., Disp: , Rfl:    hydrochlorothiazide (HYDRODIURIL) 25 MG tablet, Take 25 mg by mouth daily. (Patient not taking: Reported on 03/01/2024), Disp: , Rfl:    insulin glargine (LANTUS) 100 UNIT/ML injection, Inject 60 Units into the skin daily., Disp: , Rfl:    insulin regular (NOVOLIN  R) 100 units/mL injection, Inject into the skin 3 (three) times daily before meals., Disp: , Rfl:    lisinopril (ZESTRIL) 20 MG tablet, Take 10 mg by mouth daily., Disp: , Rfl:    Magnesium Glycinate 100 MG CAPS, Take 300 mg by mouth daily., Disp: , Rfl:    Melatonin 5 MG CAPS, Take by mouth., Disp: , Rfl:    metFORMIN (GLUCOPHAGE) 850 MG tablet, Take 850 mg by mouth 2 (two) times daily with a meal., Disp: , Rfl:    metoprolol succinate (TOPROL-XL) 25 MG 24 hr tablet, Take 25 mg by mouth daily., Disp: , Rfl:    Multiple Vitamin (MULTI-VITAMIN) tablet, Take 1 tablet by mouth daily., Disp: , Rfl:    simvastatin (ZOCOR) 40 MG tablet, Take 40 mg by mouth daily. (Patient not taking: Reported on 03/01/2024), Disp: , Rfl:    Vitamin E 45 MG (100 UNIT) CAPS, Take by mouth. (Patient not taking: Reported on 03/01/2024), Disp: , Rfl:   Past Medical History: Past Medical History:  Diagnosis Date   Chronic back pain    Chronic hip pain    High cholesterol    Hypertension    Neuropathy    Type 2 diabetes mellitus (HCC)     Tobacco Use: Social History   Tobacco Use  Smoking Status Never  Smokeless Tobacco Never    Labs: Review  Flowsheet        No data to display           Exercise Target Goals: Exercise Program Goal: Individual exercise prescription set using results from initial 6 min walk test and THRR while considering  patient's activity barriers and safety.   Exercise Prescription Goal: Initial exercise prescription builds to 30-45 minutes a day of aerobic activity, 2-3 days per week.  Home exercise guidelines will be given to patient during program as part of exercise prescription that the participant will acknowledge.   Education: Aerobic Exercise: - Group verbal and visual presentation on the components of exercise prescription. Introduces F.I.T.T principle from ACSM for exercise prescriptions.  Reviews F.I.T.T. principles of aerobic exercise including progression. Written  material given at graduation.   Education: Resistance Exercise: - Group verbal and visual presentation on the components of exercise prescription. Introduces F.I.T.T principle from ACSM for exercise prescriptions  Reviews F.I.T.T. principles of resistance exercise including progression. Written material given at graduation.    Education: Exercise & Equipment Safety: - Individual verbal instruction and demonstration of equipment use and safety with use of the equipment. Flowsheet Row Cardiac Rehab from 03/02/2024 in [facility] Cardiac and Pulmonary Rehab  Date 03/02/24  Educator [facility]  Instruction Review Code 1- Verbalizes Understanding       Education: Exercise Physiology & General Exercise Guidelines: - Group verbal and written instruction with models to review the exercise physiology of the cardiovascular system and associated critical values. Provides general exercise guidelines with specific guidelines to those with heart or lung disease.    Education: Flexibility, Balance, Mind/Body Relaxation: - Group verbal and visual presentation with interactive activity on the components of exercise prescription. Introduces F.I.T.T principle from ACSM for exercise prescriptions. Reviews F.I.T.T. principles of flexibility and balance exercise training including progression. Also discusses the mind body connection.  Reviews various relaxation techniques to help reduce and manage stress (i.e. Deep breathing, progressive muscle relaxation, and visualization). Balance handout provided to take home. Written material given at graduation.   Activity Barriers & Risk Stratification:  Activity Barriers & Cardiac Risk Stratification - 03/01/24 1453       Activity Barriers & Cardiac Risk Stratification   Activity Barriers History of Falls;Assistive Device    Cardiac Risk Stratification High             6 Minute Walk:  6 Minute Walk     Row Name 03/02/24 1141         6 Minute Walk   Phase Initial      Distance 455 feet     Walk Time 4.22 minutes     # of Rest Breaks 3     MPH 1.18     METS 1     RPE 13     Perceived Dyspnea  1     VO2 Peak 2.7     Symptoms Yes (comment)     Comments Hip pain bilateral, lower back discomfort     Resting HR 82 bpm     Resting BP 122/60     Resting Oxygen Saturation  97 %     Exercise Oxygen Saturation  during 6 min walk 99 %     Max Ex. HR 96 bpm     Max Ex. BP 144/62     2 Minute Post BP 130/62              Oxygen Initial Assessment:   Oxygen Re-Evaluation:   Oxygen Discharge (Final Oxygen Re-Evaluation):  Initial Exercise Prescription:  Initial Exercise Prescription - 03/02/24 1300       Date of Initial Exercise RX and Referring Provider   Date 03/02/24    Referring Provider Dr. Valery Gaucher      Oxygen   Maintain Oxygen Saturation 88% or higher      Recumbant Bike   Level 1    RPM 50    Watts 25    Minutes 15    METs 2      NuStep   Level 1    SPM 80    Minutes 15    METs 2      Biostep-RELP   Level 1    SPM 50    Minutes 15    METs 2      Track   Laps 16    Minutes 15    METs 2      Intensity   THRR 40-80% of Max Heartrate 107-133    Ratings of Perceived Exertion 11-13    Perceived Dyspnea 0-4      Progression   Progression Continue to progress workloads to maintain intensity without signs/symptoms of physical distress.      Resistance Training   Training Prescription Yes    Weight 2lb    Reps 10-15             Perform Capillary Blood Glucose checks as needed.  Exercise Prescription Changes:   Exercise Prescription Changes     Row Name 03/02/24 1300             Response to Exercise   Blood Pressure (Admit) 122/60       Blood Pressure (Exercise) 144/62       Blood Pressure (Exit) 130/62       Heart Rate (Admit) 82 bpm       Heart Rate (Exercise) 96 bpm       Heart Rate (Exit) 86 bpm       Oxygen Saturation (Admit) 97 %       Oxygen Saturation (Exercise) 99 %        Oxygen Saturation (Exit) 99 %       Rating of Perceived Exertion (Exercise) 13       Perceived Dyspnea (Exercise) 1       Symptoms Bilateral hip pain, back discomfort       Comments results                Exercise Comments:   Exercise Goals and Review:   Exercise Goals     Row Name 03/02/24 1309             Exercise Goals   Increase Physical Activity Yes       Intervention Provide advice, education, support and counseling about physical activity/exercise needs.;Develop an individualized exercise prescription for aerobic and resistive training based on initial evaluation findings, risk stratification, comorbidities and participant's personal goals.       Expected Outcomes Short Term: Attend rehab on a regular basis to increase amount of physical activity.;Long Term: Add in home exercise to make exercise part of routine and to increase amount of physical activity.;Long Term: Exercising regularly at least 3-5 days a week.       Increase Strength and Stamina Yes       Intervention Provide advice, education, support and counseling about physical activity/exercise needs.;Develop an individualized exercise prescription for aerobic and resistive training based on initial evaluation findings, risk stratification, comorbidities and participant's personal goals.  Expected Outcomes Long Term: Improve cardiorespiratory fitness, muscular endurance and strength as measured by increased METs and functional capacity ( );Short Term: Perform resistance training exercises routinely during rehab and add in resistance training at home;Short Term: Increase workloads from initial exercise prescription for resistance, speed, and METs.       Able to understand and use rate of perceived exertion (RPE) scale Yes       Intervention Provide education and explanation on how to use RPE scale       Expected Outcomes Short Term: Able to use RPE daily in rehab to express subjective intensity level;Long  Term:  Able to use RPE to guide intensity level when exercising independently       Able to understand and use Dyspnea scale Yes       Intervention Provide education and explanation on how to use Dyspnea scale       Expected Outcomes Short Term: Able to use Dyspnea scale daily in rehab to express subjective sense of shortness of breath during exertion;Long Term: Able to use Dyspnea scale to guide intensity level when exercising independently       Knowledge and understanding of Target Heart Rate Range (THRR) Yes       Intervention Provide education and explanation of THRR including how the numbers were predicted and where they are located for reference       Expected Outcomes Short Term: Able to state/look up THRR;Short Term: Able to use daily as guideline for intensity in rehab;Long Term: Able to use THRR to govern intensity when exercising independently       Able to check pulse independently Yes       Intervention Provide education and demonstration on how to check pulse in carotid and radial arteries.;Review the importance of being able to check your own pulse for safety during independent exercise       Expected Outcomes Short Term: Able to explain why pulse checking is important during independent exercise;Long Term: Able to check pulse independently and accurately       Understanding of Exercise Prescription Yes       Intervention Provide education, explanation, and written materials on patient's individual exercise prescription       Expected Outcomes Long Term: Able to explain home exercise prescription to exercise independently;Short Term: Able to explain program exercise prescription                Exercise Goals Re-Evaluation :   Discharge Exercise Prescription (Final Exercise Prescription Changes):  Exercise Prescription Changes - 03/02/24 1300       Response to Exercise   Blood Pressure (Admit) 122/60    Blood Pressure (Exercise) 144/62    Blood Pressure (Exit) 130/62     Heart Rate (Admit) 82 bpm    Heart Rate (Exercise) 96 bpm    Heart Rate (Exit) 86 bpm    Oxygen Saturation (Admit) 97 %    Oxygen Saturation (Exercise) 99 %    Oxygen Saturation (Exit) 99 %    Rating of Perceived Exertion (Exercise) 13    Perceived Dyspnea (Exercise) 1    Symptoms Bilateral hip pain, back discomfort    Comments results             Nutrition:  Target Goals: Understanding of nutrition guidelines, daily intake of sodium 1500mg , cholesterol 200mg , calories 30% from fat and 7% or less from saturated fats, daily to have 5 or more servings of fruits and vegetables.  Education: All About Nutrition: -Group  instruction provided by verbal, written material, interactive activities, discussions, models, and posters to present general guidelines for heart healthy nutrition including fat, fiber, MyPlate, the role of sodium in heart healthy nutrition, utilization of the nutrition label, and utilization of this knowledge for meal planning. Follow up email sent as well. Written material given at graduation.   Biometrics:  Pre Biometrics - 03/02/24 1310       Pre Biometrics   Height 5' 3.4 (1.61 m)    Weight 202 lb 12.8 oz (92 kg)    Waist Circumference 46.5 inches    Hip Circumference 41 inches    Waist to Hip Ratio 1.13 %    BMI (Calculated) 35.49    Single Leg Stand 0 seconds              Nutrition Therapy Plan and Nutrition Goals:   Nutrition Assessments:  MEDIFICTS Score Key: >=70 Need to make dietary changes  40-70 Heart Healthy Diet <= 40 Therapeutic Level Cholesterol Diet   Picture Your Plate Scores: <21 Unhealthy dietary pattern with much room for improvement. 41-50 Dietary pattern unlikely to meet recommendations for good health and room for improvement. 51-60 More healthful dietary pattern, with some room for improvement.  >60 Healthy dietary pattern, although there may be some specific behaviors that could be improved.    Nutrition Goals  Re-Evaluation:   Nutrition Goals Discharge (Final Nutrition Goals Re-Evaluation):   Psychosocial: Target Goals: Acknowledge presence or absence of significant depression and/or stress, maximize coping skills, provide positive support system. Participant is able to verbalize types and ability to use techniques and skills needed for reducing stress and depression.   Education: Stress, Anxiety, and Depression - Group verbal and visual presentation to define topics covered.  Reviews how body is impacted by stress, anxiety, and depression.  Also discusses healthy ways to reduce stress and to treat/manage anxiety and depression.  Written material given at graduation.   Education: Sleep Hygiene -Provides group verbal and written instruction about how sleep can affect your health.  Define sleep hygiene, discuss sleep cycles and impact of sleep habits. Review good sleep hygiene tips.    Initial Review & Psychosocial Screening:  Initial Psych Review & Screening - 03/01/24 1432       Initial Review   Current issues with Current Sleep Concerns   states she has to get up every 2-3 hours to use the restroom     Family Dynamics   Good Support System? Yes   husband and children/their families     Barriers   Psychosocial barriers to participate in program There are no identifiable barriers or psychosocial needs.;The patient should benefit from training in stress management and relaxation.      Screening Interventions   Interventions Encouraged to exercise             Quality of Life Scores:   Scores of 19 and below usually indicate a poorer quality of life in these areas.  A difference of  2-3 points is a clinically meaningful difference.  A difference of 2-3 points in the total score of the Quality of Life Index has been associated with significant improvement in overall quality of life, self-image, physical symptoms, and general health in studies assessing change in quality of  life.  PHQ-9: Review Flowsheet       03/02/2024  Depression screen PHQ 2/9  Decreased Interest 1  Down, Depressed, Hopeless 0  PHQ - 2 Score 1  Altered sleeping 1  Tired, decreased  energy 2  Change in appetite 2  Feeling bad or failure about yourself  0  Trouble concentrating 0  Moving slowly or fidgety/restless 0  Suicidal thoughts 0  PHQ-9 Score 6  Difficult doing work/chores Somewhat difficult   Interpretation of Total Score  Total Score Depression Severity:  1-4 = Minimal depression, 5-9 = Mild depression, 10-14 = Moderate depression, 15-19 = Moderately severe depression, 20-27 = Severe depression   Psychosocial Evaluation and Intervention:  Psychosocial Evaluation - 03/01/24 1443       Psychosocial Evaluation & Interventions   Comments 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 She stated that her blood pressure is well managed and her husband has been monitoring it twice daily since the doctor recently changed her BP medications. Patient has diabetes type 2 and takes insulin injections. Patient stated she wears a monitor on her arm to assist with blood sugar control and her last A1C was 7% of which she was pleased. Patient stated she has been working to lose weight and has lost some weight slowly and safely. Patient stated her sleep is often interrupted as she has to go to the restroom every 2-3 hours. Patient's oldest daughter and husband were involved in the initial call and patient stated she has a good support system of her family and does not note any issues with stress or depression. 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 She also uses a cane or walker, and wheelchair at times, to mobilize outside of the home. She states she prefers visual learning aids but is able to read.    Expected Outcomes ST: attend cardiac rehab for education and exercise; LT: develop and maintain positive self-care habits              Psychosocial Re-Evaluation:   Psychosocial Discharge (Final Psychosocial Re-Evaluation):   Vocational Rehabilitation: Provide vocational rehab assistance to qualifying candidates.   Vocational Rehab Evaluation & Intervention:  Vocational Rehab - 03/01/24 1434       Initial Vocational Rehab Evaluation & Intervention   Assessment shows need for Vocational Rehabilitation No             Education: Education Goals: Education classes will be provided on a variety of topics geared toward better understanding of heart health and risk factor modification. Participant will state understanding/return demonstration of topics presented as noted by education test scores.  Learning Barriers/Preferences:  Learning Barriers/Preferences - 03/01/24 1431       Learning Barriers/Preferences   Learning Barriers Hearing   wears hearing aids   Learning Preferences Individual Instruction;Verbal Instruction;Video             General Cardiac Education Topics:  AED/CPR: - Group verbal and written instruction with the use of models to demonstrate the basic use of the AED with the basic ABC's of resuscitation.   Anatomy and Cardiac Procedures: - Group verbal and visual presentation and models provide information about basic cardiac anatomy and function. Reviews the testing methods done to diagnose heart disease and the outcomes of the test results. Describes the treatment choices: Medical Management, Angioplasty, or Coronary Bypass Surgery for treating various heart conditions including Myocardial Infarction, Angina, Valve Disease, and Cardiac Arrhythmias.  Written material given at graduation.   Medication Safety: - Group verbal and visual instruction to review commonly prescribed medications for heart and lung disease. Reviews the medication, class of the drug, and side effects. Includes the steps to properly store meds and  maintain the prescription regimen.  Written material given at  graduation.   Intimacy: - Group verbal instruction through game format to discuss how heart and lung disease can affect sexual intimacy. Written material given at graduation..   Know Your Numbers and Heart Failure: - Group verbal and visual instruction to discuss disease risk factors for cardiac and pulmonary disease and treatment options.  Reviews associated critical values for Overweight/Obesity, Hypertension, Cholesterol, and Diabetes.  Discusses basics of heart failure: signs/symptoms and treatments.  Introduces Heart Failure Zone chart for action plan for heart failure.  Written material given at graduation.   Infection Prevention: - Provides verbal and written material to individual with discussion of infection control including proper hand washing and proper equipment cleaning during exercise session. Flowsheet Row Cardiac Rehab from 03/02/2024 in [facility] Cardiac and Pulmonary Rehab  Date 03/02/24  Educator [facility]  Instruction Review Code 1- Verbalizes Understanding       Falls Prevention: - Provides verbal and written material to individual with discussion of falls prevention and safety. Flowsheet Row Cardiac Rehab from 03/02/2024 in [facility] Cardiac and Pulmonary Rehab  Date 03/02/24  Educator [facility]  Instruction Review Code 1- Verbalizes Understanding       Other: -Provides group and verbal instruction on various topics (see comments)   Knowledge Questionnaire Score:   Core Components/Risk Factors/Patient Goals at Admission:  Personal Goals and Risk Factors at Admission - 03/01/24 1430       Core Components/Risk Factors/Patient Goals on Admission    Weight Management Yes    Intervention Weight Management/Obesity: Establish reasonable short term and long term weight goals.;Weight Management: Provide education and appropriate resources to help participant work on and attain dietary goals.;Obesity: Provide education and appropriate resources to help participant work on and attain  dietary goals.    Expected Outcomes Short Term: Continue to assess and modify interventions until short term weight is achieved;Long Term: Adherence to nutrition and physical activity/exercise program aimed toward attainment of established weight goal;Weight Loss: Understanding of general recommendations for a balanced deficit meal plan, which promotes 1-2 lb weight loss per week and includes a negative energy balance of [phone] kcal/d;Understanding recommendations for meals to include 15-35% energy as protein, 25-35% energy from fat, 35-60% energy from carbohydrates, less than 200mg  of dietary cholesterol, 20-35 gm of total fiber daily;Understanding of distribution of calorie intake throughout the day with the consumption of 4-5 meals/snacks    Diabetes Yes    Intervention Provide education about signs/symptoms and action to take for hypo/hyperglycemia.;Provide education about proper nutrition, including hydration, and aerobic/resistive exercise prescription along with prescribed medications to achieve blood glucose in normal ranges: Fasting glucose 65-99 mg/dL    Expected Outcomes Short Term: Participant verbalizes understanding of the signs/symptoms and immediate care of hyper/hypoglycemia, proper foot care and importance of medication, aerobic/resistive exercise and nutrition plan for blood glucose control.;Long Term: Attainment of HbA1C < 7%.    Hypertension Yes    Intervention Provide education on lifestyle modifcations including regular physical activity/exercise, weight management, moderate sodium restriction and increased consumption of fresh fruit, vegetables, and low fat dairy, alcohol moderation, and smoking cessation.;Monitor prescription use compliance.    Expected Outcomes Short Term: Continued assessment and intervention until BP is < 140/12mm HG in hypertensive participants. < 130/51mm HG in hypertensive participants with diabetes, heart failure or chronic kidney disease.;Long Term:  Maintenance of blood pressure at goal levels.    Lipids Yes    Intervention Provide education and support for participant on nutrition & aerobic/resistive  exercise along with prescribed medications to achieve LDL 70mg , HDL >40mg .    Expected Outcomes Long Term: Cholesterol controlled with medications as prescribed, with individualized exercise RX and with personalized nutrition plan. Value goals: LDL < 70mg , HDL > 40 mg.;Short Term: Participant states understanding of desired cholesterol values and is compliant with medications prescribed. Participant is following exercise prescription and nutrition guidelines.             Education:Diabetes - Individual verbal and written instruction to review signs/symptoms of diabetes, desired ranges of glucose level fasting, after meals and with exercise. Acknowledge that pre and post exercise glucose checks will be done for 3 sessions at entry of program. Flowsheet Row Cardiac Rehab from 03/02/2024 in [facility] Cardiac and Pulmonary Rehab  Date 03/02/24  Educator [facility]  Instruction Review Code 1- Verbalizes Understanding       Core Components/Risk Factors/Patient Goals Review:    Core Components/Risk Factors/Patient Goals at Discharge (Final Review):    ITP Comments:  ITP Comments     Row Name 03/01/24 1429 03/02/24 1140         ITP Comments Initial phone call completed. Diagnosis can be found in [facility] 12/25/2023. EP Orientation scheduled for 03/02/2024 @ 10:00. Completed and gym orientation for cardiac rehab. Initial ITP created and sent for review to Dr. Firman Hughes, Medical Director.               Comments: Initial ITP

## 2024-03-07 ENCOUNTER — Encounter: Admitting: *Deleted

## 2024-03-07 DIAGNOSIS — Z951 Presence of aortocoronary bypass graft: Secondary | ICD-10-CM

## 2024-03-07 DIAGNOSIS — Z48812 Encounter for surgical aftercare following surgery on the circulatory system: Secondary | ICD-10-CM | POA: Diagnosis not present

## 2024-03-07 LAB — GLUCOSE, CAPILLARY
Glucose-Capillary: 158 mg/dL — ABNORMAL HIGH (ref 70–99)
Glucose-Capillary: 146 mg/dL — ABNORMAL HIGH (ref 70–99)

## 2024-03-07 NOTE — Progress Notes (Signed)
 Daily Session Note  Patient Details  Name: Linda Tucker MRN: [id number] Date of Birth: 1951/06/26 Referring Provider:   Flowsheet Row Cardiac Rehab from 03/02/2024 in [facility] Cardiac and Pulmonary Rehab  Referring Provider Dr. Valery Gaucher    Encounter Date: 03/07/2024  Check In:  Session Check In - 03/07/24 1417       Check-In   Supervising physician immediately available to respond to emergencies See telemetry face sheet for immediately available ER MD    Location [facility]-Cardiac & Pulmonary Rehab    Staff Present Sue Em RN,BSN;Margaret Best, MS, Exercise Physiologist;Maxon Conetta BS, Exercise Physiologist;Noah Tickle, BS, Exercise Physiologist    Virtual Visit No    Medication changes reported     No    Fall or balance concerns reported    No    Warm-up and Cool-down Performed on first and last piece of equipment    Resistance Training Performed Yes    VAD Patient? No    PAD/SET Patient? No      Pain Assessment   Currently in Pain? No/denies             Social History   Tobacco Use  Smoking Status Never  Smokeless Tobacco Never    Goals Met:  Independence with exercise equipment Exercise tolerated well No report of concerns or symptoms today Strength training completed today  Goals Unmet:  Not Applicable  Comments: First full day of exercise!  Patient was oriented to gym and equipment including functions, settings, policies, and procedures.  Patient's individual exercise prescription and treatment plan were reviewed.  All starting workloads were established based on the results of the 6 minute walk test done at initial orientation visit.  The plan for exercise progression was also introduced and progression will be customized based on patient's performance and goals.     Dr. Firman Hughes is Medical Director for [facility] Cardiac Rehabilitation.  Dr. Fuad Aleskerov is Medical Director for [facility] Pulmonary Rehabilitation.

## 2024-03-09 ENCOUNTER — Encounter: Admitting: *Deleted

## 2024-03-09 DIAGNOSIS — Z951 Presence of aortocoronary bypass graft: Secondary | ICD-10-CM

## 2024-03-09 DIAGNOSIS — Z48812 Encounter for surgical aftercare following surgery on the circulatory system: Secondary | ICD-10-CM | POA: Diagnosis not present

## 2024-03-09 LAB — GLUCOSE, CAPILLARY
Glucose-Capillary: 125 mg/dL — ABNORMAL HIGH (ref 70–99)
Glucose-Capillary: 116 mg/dL — ABNORMAL HIGH (ref 70–99)

## 2024-03-09 NOTE — Progress Notes (Signed)
 Daily Session Note  Patient Details  Name: Linda Tucker MRN: [id number] Date of Birth: Nov 30, 1950 Referring Provider:   Flowsheet Row Cardiac Rehab from 03/02/2024 in [facility] Cardiac and Pulmonary Rehab  Referring Provider Dr. Valery Gaucher    Encounter Date: 03/09/2024  Check In:  Session Check In - 03/09/24 1421       Check-In   Supervising physician immediately available to respond to emergencies See telemetry face sheet for immediately available ER MD    Location [facility]-Cardiac & Pulmonary Rehab    Staff Present Sue Em RN,BSN;Susanne Bice, RN, BSN, CCRP;Jason Martina Sledge RDN,LDN;Noah Tickle, BS, Exercise Physiologist    Virtual Visit No    Medication changes reported     No    Fall or balance concerns reported    No    Warm-up and Cool-down Performed on first and last piece of equipment    Resistance Training Performed Yes    VAD Patient? No    PAD/SET Patient? No      Pain Assessment   Currently in Pain? No/denies             Social History   Tobacco Use  Smoking Status Never  Smokeless Tobacco Never    Goals Met:  Independence with exercise equipment Exercise tolerated well No report of concerns or symptoms today Strength training completed today  Goals Unmet:  Not Applicable  Comments: Pt able to follow exercise prescription today without complaint.  Will continue to monitor for progression.    Dr. Firman Hughes is Medical Director for [facility] Cardiac Rehabilitation.  Dr. Fuad Aleskerov is Medical Director for [facility] Pulmonary Rehabilitation.

## 2024-03-10 ENCOUNTER — Encounter: Admitting: *Deleted

## 2024-03-10 DIAGNOSIS — Z951 Presence of aortocoronary bypass graft: Secondary | ICD-10-CM

## 2024-03-10 DIAGNOSIS — Z48812 Encounter for surgical aftercare following surgery on the circulatory system: Secondary | ICD-10-CM | POA: Diagnosis not present

## 2024-03-10 LAB — GLUCOSE, CAPILLARY
Glucose-Capillary: 132 mg/dL — ABNORMAL HIGH (ref 70–99)
Glucose-Capillary: 105 mg/dL — ABNORMAL HIGH (ref 70–99)

## 2024-03-10 NOTE — Progress Notes (Signed)
 Daily Session Note  Patient Details  Name: Linda Tucker MRN: [id number] Date of Birth: [birth year] Referring Provider:   Flowsheet Row Cardiac Rehab from 03/02/2024 in [facility] Cardiac and Pulmonary Rehab  Referring Provider Dr. Valery Gaucher    Encounter Date: 03/10/2024  Check In:  Session Check In - 03/10/24 1403       Check-In   Supervising physician immediately available to respond to emergencies See telemetry face sheet for immediately available ER MD    Location [facility]-Cardiac & Pulmonary Rehab    Staff Present Sue Em RN,BSN;Joseph Neldon [place], [place], RRT, CPFT;Noah Tickle, BS, Exercise Physiologist    Virtual Visit No    Medication changes reported     No    Fall or balance concerns reported    No    Warm-up and Cool-down Performed on first and last piece of equipment    Resistance Training Performed Yes    VAD Patient? No    PAD/SET Patient? No      Pain Assessment   Currently in Pain? No/denies             Social History   Tobacco Use  Smoking Status Never  Smokeless Tobacco Never    Goals Met:  Independence with exercise equipment Exercise tolerated well No report of concerns or symptoms today Strength training completed today  Goals Unmet:  Not Applicable  Comments: Pt able to follow exercise prescription today without complaint.  Will continue to monitor for progression.    Dr. Firman Hughes is Medical Director for [facility] Cardiac Rehabilitation.  Dr. Fuad Aleskerov is Medical Director for [facility] Pulmonary Rehabilitation.

## 2024-03-14 ENCOUNTER — Encounter: Admitting: *Deleted

## 2024-03-14 DIAGNOSIS — Z48812 Encounter for surgical aftercare following surgery on the circulatory system: Secondary | ICD-10-CM | POA: Diagnosis not present

## 2024-03-14 DIAGNOSIS — Z951 Presence of aortocoronary bypass graft: Secondary | ICD-10-CM

## 2024-03-14 NOTE — Progress Notes (Signed)
 Daily Session Note  Patient Details  Name: Linda Tucker MRN: [id number] Date of Birth: 10-May-1951 Referring Provider:   Flowsheet Row Cardiac Rehab from 03/02/2024 in [facility] Cardiac and Pulmonary Rehab  Referring Provider Dr. Toni Cool    Encounter Date: 03/14/2024  Check In:  Session Check In - 03/14/24 1415       Check-In   Supervising physician immediately available to respond to emergencies See telemetry face sheet for immediately available ER MD    Location [facility]-Cardiac & Pulmonary Rehab    Staff Present Hoy Rodney RN,BSN;Kelly Dyane BS, ACSM CEP, Exercise Physiologist;Maxon Conetta BS, Exercise Physiologist;Noah Tickle, BS, Exercise Physiologist;Kelly Bollinger [facility]    Virtual Visit No    Medication changes reported     No    Fall or balance concerns reported    No    Warm-up and Cool-down Performed on first and last piece of equipment    Resistance Training Performed Yes    VAD Patient? No    PAD/SET Patient? No      Pain Assessment   Currently in Pain? No/denies             Social History   Tobacco Use  Smoking Status Never  Smokeless Tobacco Never    Goals Met:  Independence with exercise equipment Exercise tolerated well No report of concerns or symptoms today Strength training completed today  Goals Unmet:  Not Applicable  Comments: Pt able to follow exercise prescription today without complaint.  Will continue to monitor for progression.    Dr. Oneil Pinal is Medical Director for [facility] Cardiac Rehabilitation.  Dr. Fuad Aleskerov is Medical Director for [facility] Pulmonary Rehabilitation.

## 2024-03-16 ENCOUNTER — Encounter

## 2024-03-16 DIAGNOSIS — Z48812 Encounter for surgical aftercare following surgery on the circulatory system: Secondary | ICD-10-CM | POA: Diagnosis not present

## 2024-03-16 DIAGNOSIS — Z951 Presence of aortocoronary bypass graft: Secondary | ICD-10-CM

## 2024-03-16 NOTE — Progress Notes (Signed)
 Daily Session Note  Patient Details  Name: Linda Tucker MRN: [id number] Date of Birth: 1951/07/13 Referring Provider:   Flowsheet Row Cardiac Rehab from 03/02/2024 in [facility] Cardiac and Pulmonary Rehab  Referring Provider Dr. Toni Cool    Encounter Date: 03/16/2024  Check In:  Session Check In - 03/16/24 1359       Check-In   Supervising physician immediately available to respond to emergencies See telemetry face sheet for immediately available ER MD    Location [facility]-Cardiac & Pulmonary Rehab    Staff Present Devaughn Jaeger, BS, Exercise Physiologist;Thelda Gagan RN,BSN,MPA;Rhona Fusilier Dyane BS, ACSM CEP, Exercise Physiologist;Jason Elnor RDN,LDN;Maxon Conetta BS, Exercise Physiologist    Virtual Visit No    Medication changes reported     No    Fall or balance concerns reported    No    Tobacco Cessation No Change    Warm-up and Cool-down Performed on first and last piece of equipment    Resistance Training Performed Yes    VAD Patient? No    PAD/SET Patient? No      Pain Assessment   Currently in Pain? No/denies             Social History   Tobacco Use  Smoking Status Never  Smokeless Tobacco Never    Goals Met:  Independence with exercise equipment Exercise tolerated well No report of concerns or symptoms today Strength training completed today  Goals Unmet:  Not Applicable  Comments: Pt able to follow exercise prescription today without complaint.  Will continue to monitor for progression.    Dr. Oneil Pinal is Medical Director for [facility] Cardiac Rehabilitation.  Dr. Fuad Aleskerov is Medical Director for [facility] Pulmonary Rehabilitation.

## 2024-03-17 ENCOUNTER — Encounter: Admitting: *Deleted

## 2024-03-17 DIAGNOSIS — Z48812 Encounter for surgical aftercare following surgery on the circulatory system: Secondary | ICD-10-CM | POA: Diagnosis not present

## 2024-03-17 DIAGNOSIS — Z951 Presence of aortocoronary bypass graft: Secondary | ICD-10-CM

## 2024-03-17 NOTE — Progress Notes (Signed)
 Daily Session Note  Patient Details  Name: Linda Tucker MRN: [id number] Date of Birth: [birth year] Referring Provider:   Flowsheet Row Cardiac Rehab from 03/02/2024 in [facility] Cardiac and Pulmonary Rehab  Referring Provider Dr. Toni Cool    Encounter Date: 03/17/2024  Check In:  Session Check In - 03/17/24 1432       Check-In   Supervising physician immediately available to respond to emergencies See telemetry face sheet for immediately available ER MD    Location [facility]-Cardiac & Pulmonary Rehab    Staff Present Hoy Rodney RN,BSN;Joseph [facility] BS, Exercise Physiologist;Noah Tickle, BS, Exercise Physiologist    Virtual Visit No    Medication changes reported     No    Fall or balance concerns reported    No    Warm-up and Cool-down Performed on first and last piece of equipment    Resistance Training Performed Yes    VAD Patient? No    PAD/SET Patient? No      Pain Assessment   Currently in Pain? No/denies             Social History   Tobacco Use  Smoking Status Never  Smokeless Tobacco Never    Goals Met:  Independence with exercise equipment Exercise tolerated well No report of concerns or symptoms today Strength training completed today  Goals Unmet:  Not Applicable  Comments: Pt able to follow exercise prescription today without complaint.  Will continue to monitor for progression.    Dr. Oneil Pinal is Medical Director for [facility] Cardiac Rehabilitation.  Dr. Fuad Aleskerov is Medical Director for [facility] Pulmonary Rehabilitation.

## 2024-03-21 ENCOUNTER — Encounter: Admitting: *Deleted

## 2024-03-21 DIAGNOSIS — Z48812 Encounter for surgical aftercare following surgery on the circulatory system: Secondary | ICD-10-CM | POA: Diagnosis not present

## 2024-03-21 DIAGNOSIS — Z951 Presence of aortocoronary bypass graft: Secondary | ICD-10-CM

## 2024-03-21 NOTE — Progress Notes (Signed)
 Daily Session Note  Patient Details  Name: KATELEN LUEPKE MRN: [id number] Date of Birth: [birth year] Referring Provider:   Flowsheet Row Cardiac Rehab from 03/02/2024 in [facility] Cardiac and Pulmonary Rehab  Referring Provider Dr. Toni Cool    Encounter Date: 03/21/2024  Check In:  Session Check In - 03/21/24 1406       Check-In   Supervising physician immediately available to respond to emergencies See telemetry face sheet for immediately available ER MD    Location [facility]-Cardiac & Pulmonary Rehab    Staff Present Hoy Rodney RN,BSN;Margaret Best, MS, Exercise Physiologist;Noah Tickle, BS, Exercise Physiologist;Kelly Bollinger [facility]    Virtual Visit No    Medication changes reported     No    Fall or balance concerns reported    No    Warm-up and Cool-down Performed on first and last piece of equipment    Resistance Training Performed Yes    VAD Patient? No    PAD/SET Patient? No      Pain Assessment   Currently in Pain? No/denies             Social History   Tobacco Use  Smoking Status Never  Smokeless Tobacco Never    Goals Met:  Independence with exercise equipment Exercise tolerated well No report of concerns or symptoms today Strength training completed today  Goals Unmet:  Not Applicable  Comments: Pt able to follow exercise prescription today without complaint.  Will continue to monitor for progression.    Dr. Oneil Pinal is Medical Director for [facility] Cardiac Rehabilitation.  Dr. Fuad Aleskerov is Medical Director for [facility] Pulmonary Rehabilitation.

## 2024-03-23 ENCOUNTER — Encounter: Attending: Cardiology | Admitting: *Deleted

## 2024-03-23 ENCOUNTER — Encounter: Payer: Self-pay | Admitting: *Deleted

## 2024-03-23 DIAGNOSIS — Z48812 Encounter for surgical aftercare following surgery on the circulatory system: Secondary | ICD-10-CM | POA: Insufficient documentation

## 2024-03-23 DIAGNOSIS — Z951 Presence of aortocoronary bypass graft: Secondary | ICD-10-CM

## 2024-03-23 NOTE — Progress Notes (Signed)
 Daily Session Note  Patient Details  Name: Linda Tucker MRN: [id number] Date of Birth: 1951-09-19 Referring Provider:   Flowsheet Row Cardiac Rehab from 03/02/2024 in [facility] Cardiac and Pulmonary Rehab  Referring Provider Dr. Toni Cool    Encounter Date: 03/23/2024  Check In:  Session Check In - 03/23/24 1417       Check-In   Supervising physician immediately available to respond to emergencies See telemetry face sheet for immediately available ER MD    Location [facility]-Cardiac & Pulmonary Rehab    Staff Present Hoy Rodney RN,BSN;Kristen Coble RN,BC,MSN;Kelly Bollinger RN,BSN,MPA;Noah Tickle, BS, Exercise Physiologist    Virtual Visit No    Medication changes reported     No    Fall or balance concerns reported    No    Warm-up and Cool-down Performed on first and last piece of equipment    Resistance Training Performed Yes    VAD Patient? No    PAD/SET Patient? No      Pain Assessment   Currently in Pain? No/denies             Social History   Tobacco Use  Smoking Status Never  Smokeless Tobacco Never    Goals Met:  Independence with exercise equipment Exercise tolerated well No report of concerns or symptoms today Strength training completed today  Goals Unmet:  Not Applicable  Comments: Pt able to follow exercise prescription today without complaint.  Will continue to monitor for progression.    Dr. Oneil Pinal is Medical Director for [facility] Cardiac Rehabilitation.  Dr. Fuad Aleskerov is Medical Director for [facility] Pulmonary Rehabilitation.

## 2024-03-23 NOTE — Progress Notes (Signed)
 Cardiac Individual Treatment Plan  Patient Details  Name: Linda Tucker MRN: [id number] Date of Birth: 09-Feb-1951 Referring Provider:   Flowsheet Row Cardiac Rehab from 03/02/2024 in [facility] Cardiac and Pulmonary Rehab  Referring Provider Dr. Toni Cool    Initial Encounter Date:  Flowsheet Row Cardiac Rehab from 03/02/2024 in [facility] Cardiac and Pulmonary Rehab  Date 03/02/24    Visit Diagnosis: S/P CABG x 4  Patient's Home Medications on Admission:  Current Outpatient Medications:    apixaban (ELIQUIS) 5 MG TABS tablet, Take 5 mg by mouth 2 (two) times daily., Disp: , Rfl:    atorvastatin (LIPITOR) 40 MG tablet, Take 40 mg by mouth daily., Disp: , Rfl:    benzonatate  (TESSALON ) 200 MG capsule, Take 1 capsule (200 mg total) by mouth 3 (three) times daily as needed for cough. (Patient not taking: Reported on 03/01/2024), Disp: 30 capsule, Rfl: 0   Chromium 200 MCG CAPS, Take 200 mcg by mouth daily., Disp: , Rfl:    clopidogrel (PLAVIX) 75 MG tablet, Take 75 mg by mouth daily., Disp: , Rfl:    co-enzyme Q-10 30 MG capsule, Take by mouth. (Patient not taking: Reported on 03/01/2024), Disp: , Rfl:    cyanocobalamin (VITAMIN B12) 1000 MCG tablet, Take 1,000 mcg by mouth daily., Disp: , Rfl:    diltiazem (CARDIZEM CD) 180 MG 24 hr capsule, Take 120 mg by mouth daily., Disp: , Rfl:    fexofenadine (ALLEGRA ODT) 30 MG disintegrating tablet, Take 30 mg by mouth daily., Disp: , Rfl:    fluticasone  (FLONASE ) 50 MCG/ACT nasal spray, Place 2 sprays into both nostrils daily. (Patient not taking: Reported on 03/01/2024), Disp: 16 g, Rfl: 0   gabapentin (NEURONTIN) 300 MG capsule, Take 900 mg by mouth 2 (two) times daily., Disp: , Rfl:    hydrochlorothiazide (HYDRODIURIL) 25 MG tablet, Take 25 mg by mouth daily. (Patient not taking: Reported on 03/01/2024), Disp: , Rfl:    insulin glargine (LANTUS) 100 UNIT/ML injection, Inject 60 Units into the skin daily., Disp: , Rfl:    insulin regular (NOVOLIN R) 100  units/mL injection, Inject into the skin 3 (three) times daily before meals., Disp: , Rfl:    lisinopril (ZESTRIL) 20 MG tablet, Take 10 mg by mouth daily., Disp: , Rfl:    Magnesium Glycinate 100 MG CAPS, Take 300 mg by mouth daily., Disp: , Rfl:    Melatonin 5 MG CAPS, Take by mouth., Disp: , Rfl:    metFORMIN (GLUCOPHAGE) 850 MG tablet, Take 850 mg by mouth 2 (two) times daily with a meal., Disp: , Rfl:    metoprolol succinate (TOPROL-XL) 25 MG 24 hr tablet, Take 25 mg by mouth daily., Disp: , Rfl:    Multiple Vitamin (MULTI-VITAMIN) tablet, Take 1 tablet by mouth daily., Disp: , Rfl:    simvastatin (ZOCOR) 40 MG tablet, Take 40 mg by mouth daily. (Patient not taking: Reported on 03/01/2024), Disp: , Rfl:    Vitamin E 45 MG (100 UNIT) CAPS, Take by mouth. (Patient not taking: Reported on 03/01/2024), Disp: , Rfl:   Past Medical History: Past Medical History:  Diagnosis Date   Chronic back pain    Chronic hip pain    High cholesterol    Hypertension    Neuropathy    Type 2 diabetes mellitus (HCC)     Tobacco Use: Social History   Tobacco Use  Smoking Status Never  Smokeless Tobacco Never    Labs: Review Flowsheet  No data to display           Exercise Target Goals: Exercise Program Goal: Individual exercise prescription set using results from initial 6 min walk test and THRR while considering  patient's activity barriers and safety.   Exercise Prescription Goal: Initial exercise prescription builds to 30-45 minutes a day of aerobic activity, 2-3 days per week.  Home exercise guidelines will be given to patient during program as part of exercise prescription that the participant will acknowledge.   Education: Aerobic Exercise: - Group verbal and visual presentation on the components of exercise prescription. Introduces F.I.T.T principle from ACSM for exercise prescriptions.  Reviews F.I.T.T. principles of aerobic exercise including progression. Written material  given at graduation.   Education: Resistance Exercise: - Group verbal and visual presentation on the components of exercise prescription. Introduces F.I.T.T principle from ACSM for exercise prescriptions  Reviews F.I.T.T. principles of resistance exercise including progression. Written material given at graduation.    Education: Exercise & Equipment Safety: - Individual verbal instruction and demonstration of equipment use and safety with use of the equipment. Flowsheet Row Cardiac Rehab from 03/16/2024 in [facility] Cardiac and Pulmonary Rehab  Date 03/02/24  Educator [facility]  Instruction Review Code 1- Verbalizes Understanding    Education: Exercise Physiology & General Exercise Guidelines: - Group verbal and written instruction with models to review the exercise physiology of the cardiovascular system and associated critical values. Provides general exercise guidelines with specific guidelines to those with heart or lung disease.    Education: Flexibility, Balance, Mind/Body Relaxation: - Group verbal and visual presentation with interactive activity on the components of exercise prescription. Introduces F.I.T.T principle from ACSM for exercise prescriptions. Reviews F.I.T.T. principles of flexibility and balance exercise training including progression. Also discusses the mind body connection.  Reviews various relaxation techniques to help reduce and manage stress (i.e. Deep breathing, progressive muscle relaxation, and visualization). Balance handout provided to take home. Written material given at graduation.   Activity Barriers & Risk Stratification:  Activity Barriers & Cardiac Risk Stratification - 03/01/24 1453       Activity Barriers & Cardiac Risk Stratification   Activity Barriers History of Falls;Assistive Device    Cardiac Risk Stratification High          6 Minute Walk:  6 Minute Walk     Row Name 03/02/24 1141         6 Minute Walk   Phase Initial     Distance 455 feet      Walk Time 4.22 minutes     # of Rest Breaks 3     MPH 1.18     METS 1     RPE 13     Perceived Dyspnea  1     VO2 Peak 2.7     Symptoms Yes (comment)     Comments Hip pain bilateral, lower back discomfort     Resting HR 82 bpm     Resting BP 122/60     Resting Oxygen Saturation  97 %     Exercise Oxygen Saturation  during 6 min walk 99 %     Max Ex. HR 96 bpm     Max Ex. BP 144/62     2 Minute Post BP 130/62        Oxygen Initial Assessment:   Oxygen Re-Evaluation:   Oxygen Discharge (Final Oxygen Re-Evaluation):   Initial Exercise Prescription:  Initial Exercise Prescription - 03/02/24 1300       Date of  Initial Exercise RX and Referring Provider   Date 03/02/24    Referring Provider Dr. Toni Cool      Oxygen   Maintain Oxygen Saturation 88% or higher      Recumbant Bike   Level 1    RPM 50    Watts 25    Minutes 15    METs 2      NuStep   Level 1    SPM 80    Minutes 15    METs 2      Biostep-RELP   Level 1    SPM 50    Minutes 15    METs 2      Track   Laps 16    Minutes 15    METs 2      Intensity   THRR 40-80% of Max Heartrate 107-133    Ratings of Perceived Exertion 11-13    Perceived Dyspnea 0-4      Progression   Progression Continue to progress workloads to maintain intensity without signs/symptoms of physical distress.      Resistance Training   Training Prescription Yes    Weight 2lb    Reps 10-15          Perform Capillary Blood Glucose checks as needed.  Exercise Prescription Changes:   Exercise Prescription Changes     Row Name 03/02/24 1300             Response to Exercise   Blood Pressure (Admit) 122/60       Blood Pressure (Exercise) 144/62       Blood Pressure (Exit) 130/62       Heart Rate (Admit) 82 bpm       Heart Rate (Exercise) 96 bpm       Heart Rate (Exit) 86 bpm       Oxygen Saturation (Admit) 97 %       Oxygen Saturation (Exercise) 99 %       Oxygen Saturation (Exit) 99 %        Rating of Perceived Exertion (Exercise) 13       Perceived Dyspnea (Exercise) 1       Symptoms Bilateral hip pain, back discomfort       Comments results          Exercise Comments:   Exercise Comments     Row Name 03/07/24 1418           Exercise Comments First full day of exercise!  Patient was oriented to gym and equipment including functions, settings, policies, and procedures.  Patient's individual exercise prescription and treatment plan were reviewed.  All starting workloads were established based on the results of the 6 minute walk test done at initial orientation visit.  The plan for exercise progression was also introduced and progression will be customized based on patient's performance and goals.          Exercise Goals and Review:   Exercise Goals     Row Name 03/02/24 1309             Exercise Goals   Increase Physical Activity Yes       Intervention Provide advice, education, support and counseling about physical activity/exercise needs.;Develop an individualized exercise prescription for aerobic and resistive training based on initial evaluation findings, risk stratification, comorbidities and participant's personal goals.       Expected Outcomes Short Term: Attend rehab on a regular basis to increase amount of physical activity.;Long Term: Add  in home exercise to make exercise part of routine and to increase amount of physical activity.;Long Term: Exercising regularly at least 3-5 days a week.       Increase Strength and Stamina Yes       Intervention Provide advice, education, support and counseling about physical activity/exercise needs.;Develop an individualized exercise prescription for aerobic and resistive training based on initial evaluation findings, risk stratification, comorbidities and participant's personal goals.       Expected Outcomes Long Term: Improve cardiorespiratory fitness, muscular endurance and strength as measured by increased METs and  functional capacity ( );Short Term: Perform resistance training exercises routinely during rehab and add in resistance training at home;Short Term: Increase workloads from initial exercise prescription for resistance, speed, and METs.       Able to understand and use rate of perceived exertion (RPE) scale Yes       Intervention Provide education and explanation on how to use RPE scale       Expected Outcomes Short Term: Able to use RPE daily in rehab to express subjective intensity level;Long Term:  Able to use RPE to guide intensity level when exercising independently       Able to understand and use Dyspnea scale Yes       Intervention Provide education and explanation on how to use Dyspnea scale       Expected Outcomes Short Term: Able to use Dyspnea scale daily in rehab to express subjective sense of shortness of breath during exertion;Long Term: Able to use Dyspnea scale to guide intensity level when exercising independently       Knowledge and understanding of Target Heart Rate Range (THRR) Yes       Intervention Provide education and explanation of THRR including how the numbers were predicted and where they are located for reference       Expected Outcomes Short Term: Able to state/look up THRR;Short Term: Able to use daily as guideline for intensity in rehab;Long Term: Able to use THRR to govern intensity when exercising independently       Able to check pulse independently Yes       Intervention Provide education and demonstration on how to check pulse in carotid and radial arteries.;Review the importance of being able to check your own pulse for safety during independent exercise       Expected Outcomes Short Term: Able to explain why pulse checking is important during independent exercise;Long Term: Able to check pulse independently and accurately       Understanding of Exercise Prescription Yes       Intervention Provide education, explanation, and written materials on patient's  individual exercise prescription       Expected Outcomes Long Term: Able to explain home exercise prescription to exercise independently;Short Term: Able to explain program exercise prescription          Exercise Goals Re-Evaluation :  Exercise Goals Re-Evaluation     Row Name 03/07/24 1420             Exercise Goal Re-Evaluation   Exercise Goals Review Increase Physical Activity;Able to understand and use rate of perceived exertion (RPE) scale;Knowledge and understanding of Target Heart Rate Range (THRR);Understanding of Exercise Prescription;Increase Strength and Stamina;Able to check pulse independently       Comments Reviewed RPE and dyspnea scale, THR and program prescription with pt today.  Pt voiced understanding and was given a copy of goals to take home.       Expected Outcomes Short:  Use RPE daily to regulate intensity.  Long: Follow program prescription in THR.          Discharge Exercise Prescription (Final Exercise Prescription Changes):  Exercise Prescription Changes - 03/02/24 1300       Response to Exercise   Blood Pressure (Admit) 122/60    Blood Pressure (Exercise) 144/62    Blood Pressure (Exit) 130/62    Heart Rate (Admit) 82 bpm    Heart Rate (Exercise) 96 bpm    Heart Rate (Exit) 86 bpm    Oxygen Saturation (Admit) 97 %    Oxygen Saturation (Exercise) 99 %    Oxygen Saturation (Exit) 99 %    Rating of Perceived Exertion (Exercise) 13    Perceived Dyspnea (Exercise) 1    Symptoms Bilateral hip pain, back discomfort    Comments results          Nutrition:  Target Goals: Understanding of nutrition guidelines, daily intake of sodium 1500mg , cholesterol 200mg , calories 30% from fat and 7% or less from saturated fats, daily to have 5 or more servings of fruits and vegetables.  Education: All About Nutrition: -Group instruction provided by verbal, written material, interactive activities, discussions, models, and posters to present general  guidelines for heart healthy nutrition including fat, fiber, MyPlate, the role of sodium in heart healthy nutrition, utilization of the nutrition label, and utilization of this knowledge for meal planning. Follow up email sent as well. Written material given at graduation.   Biometrics:  Pre Biometrics - 03/02/24 1310       Pre Biometrics   Height 5' 3.4 (1.61 m)    Weight 202 lb 12.8 oz (92 kg)    Waist Circumference 46.5 inches    Hip Circumference 41 inches    Waist to Hip Ratio 1.13 %    BMI (Calculated) 35.49    Single Leg Stand 0 seconds           Nutrition Therapy Plan and Nutrition Goals:   Nutrition Assessments:  MEDIFICTS Score Key: >=70 Need to make dietary changes  40-70 Heart Healthy Diet <= 40 Therapeutic Level Cholesterol Diet  Flowsheet Row Cardiac Rehab from 03/07/2024 in [facility] Cardiac and Pulmonary Rehab  Picture Your Plate Total Score on Admission 52   Picture Your Plate Scores: <59 Unhealthy dietary pattern with much room for improvement. 41-50 Dietary pattern unlikely to meet recommendations for good health and room for improvement. 51-60 More healthful dietary pattern, with some room for improvement.  >60 Healthy dietary pattern, although there may be some specific behaviors that could be improved.    Nutrition Goals Re-Evaluation:   Nutrition Goals Discharge (Final Nutrition Goals Re-Evaluation):   Psychosocial: Target Goals: Acknowledge presence or absence of significant depression and/or stress, maximize coping skills, provide positive support system. Participant is able to verbalize types and ability to use techniques and skills needed for reducing stress and depression.   Education: Stress, Anxiety, and Depression - Group verbal and visual presentation to define topics covered.  Reviews how body is impacted by stress, anxiety, and depression.  Also discusses healthy ways to reduce stress and to treat/manage anxiety and depression.  Written  material given at graduation.   Education: Sleep Hygiene -Provides group verbal and written instruction about how sleep can affect your health.  Define sleep hygiene, discuss sleep cycles and impact of sleep habits. Review good sleep hygiene tips.    Initial Review & Psychosocial Screening:  Initial Psych Review & Screening - 03/01/24 1432  Initial Review   Current issues with Current Sleep Concerns   states she has to get up every 2-3 hours to use the restroom     Family Dynamics   Good Support System? Yes   husband and children/their families     Barriers   Psychosocial barriers to participate in program There are no identifiable barriers or psychosocial needs.;The patient should benefit from training in stress management and relaxation.      Screening Interventions   Interventions Encouraged to exercise          Quality of Life Scores:   Quality of Life - 03/08/24 1552       Quality of Life   Select Quality of Life      Quality of Life Scores   Health/Function Pre 26.1 %    Socioeconomic Pre 27.69 %    Psych/Spiritual Pre 28.57 %    Family Pre 29.5 %    GLOBAL Pre 27.44 %         Scores of 19 and below usually indicate a poorer quality of life in these areas.  A difference of  2-3 points is a clinically meaningful difference.  A difference of 2-3 points in the total score of the Quality of Life Index has been associated with significant improvement in overall quality of life, self-image, physical symptoms, and general health in studies assessing change in quality of life.  PHQ-9: Review Flowsheet       03/02/2024  Depression screen PHQ 2/9  Decreased Interest 1  Down, Depressed, Hopeless 0  PHQ - 2 Score 1  Altered sleeping 1  Tired, decreased energy 2  Change in appetite 2  Feeling bad or failure about yourself  0  Trouble concentrating 0  Moving slowly or fidgety/restless 0  Suicidal thoughts 0  PHQ-9 Score 6  Difficult doing work/chores  Somewhat difficult   Interpretation of Total Score  Total Score Depression Severity:  1-4 = Minimal depression, 5-9 = Mild depression, 10-14 = Moderate depression, 15-19 = Moderately severe depression, 20-27 = Severe depression   Psychosocial Evaluation and Intervention:  Psychosocial Evaluation - 03/01/24 1443       Psychosocial Evaluation & Interventions   Comments 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 She stated that her blood pressure is well managed and her husband has been monitoring it twice daily since the doctor recently changed her BP medications. Patient has diabetes type 2 and takes insulin injections. Patient stated she wears a monitor on her arm to assist with blood sugar control and her last A1C was 7% of which she was pleased. Patient stated she has been working to lose weight and has lost some weight slowly and safely. Patient stated her sleep is often interrupted as she has to go to the restroom every 2-3 hours. Patient's oldest daughter and husband were involved in the initial call and patient stated she has a good support system of her family and does not note any issues with stress or depression. 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 She also uses a cane or walker, and wheelchair at times, to mobilize outside of the home. She states she prefers visual learning aids but is able to read.    Expected Outcomes ST: attend cardiac rehab for education and exercise; LT: develop and maintain positive self-care habits          Psychosocial Re-Evaluation:   Psychosocial Discharge (Final Psychosocial Re-Evaluation):  Vocational Rehabilitation: Provide vocational rehab assistance to qualifying candidates.   Vocational Rehab Evaluation & Intervention:  Vocational Rehab - 03/01/24 1434       Initial Vocational Rehab Evaluation & Intervention   Assessment shows need for Vocational Rehabilitation No           Education: Education Goals: Education classes will be provided on a variety of topics geared toward better understanding of heart health and risk factor modification. Participant will state understanding/return demonstration of topics presented as noted by education test scores.  Learning Barriers/Preferences:  Learning Barriers/Preferences - 03/01/24 1431       Learning Barriers/Preferences   Learning Barriers Hearing   wears hearing aids   Learning Preferences Individual Instruction;Verbal Instruction;Video          General Cardiac Education Topics:  AED/CPR: - Group verbal and written instruction with the use of models to demonstrate the basic use of the AED with the basic ABC's of resuscitation.   Anatomy and Cardiac Procedures: - Group verbal and visual presentation and models provide information about basic cardiac anatomy and function. Reviews the testing methods done to diagnose heart disease and the outcomes of the test results. Describes the treatment choices: Medical Management, Angioplasty, or Coronary Bypass Surgery for treating various heart conditions including Myocardial Infarction, Angina, Valve Disease, and Cardiac Arrhythmias.  Written material given at graduation.   Medication Safety: - Group verbal and visual instruction to review commonly prescribed medications for heart and lung disease. Reviews the medication, class of the drug, and side effects. Includes the steps to properly store meds and maintain the prescription regimen.  Written material given at graduation. Flowsheet Row Cardiac Rehab from 03/16/2024 in [facility] Cardiac and Pulmonary Rehab  Date 03/16/24  Educator sb  Instruction Review Code 1- Verbalizes Understanding    Intimacy: - Group verbal instruction through game format to discuss how heart and lung disease can affect sexual intimacy. Written material given at graduation..   Know Your Numbers and Heart Failure: - Group verbal and visual  instruction to discuss disease risk factors for cardiac and pulmonary disease and treatment options.  Reviews associated critical values for Overweight/Obesity, Hypertension, Cholesterol, and Diabetes.  Discusses basics of heart failure: signs/symptoms and treatments.  Introduces Heart Failure Zone chart for action plan for heart failure.  Written material given at graduation.   Infection Prevention: - Provides verbal and written material to individual with discussion of infection control including proper hand washing and proper equipment cleaning during exercise session. Flowsheet Row Cardiac Rehab from 03/16/2024 in [facility] Cardiac and Pulmonary Rehab  Date 03/02/24  Educator [facility]  Instruction Review Code 1- Verbalizes Understanding    Falls Prevention: - Provides verbal and written material to individual with discussion of falls prevention and safety. Flowsheet Row Cardiac Rehab from 03/16/2024 in [facility] Cardiac and Pulmonary Rehab  Date 03/02/24  Educator [facility]  Instruction Review Code 1- Verbalizes Understanding    Other: -Provides group and verbal instruction on various topics (see comments)   Knowledge Questionnaire Score:   Core Components/Risk Factors/Patient Goals at Admission:  Personal Goals and Risk Factors at Admission - 03/01/24 1430       Core Components/Risk Factors/Patient Goals on Admission    Weight Management Yes    Intervention Weight Management/Obesity: Establish reasonable short term and long term weight goals.;Weight Management: Provide education and appropriate resources to help participant work on and attain dietary goals.;Obesity: Provide education and appropriate resources to help participant work on and attain dietary goals.  Expected Outcomes Short Term: Continue to assess and modify interventions until short term weight is achieved;Long Term: Adherence to nutrition and physical activity/exercise program aimed toward attainment of established weight goal;Weight  Loss: Understanding of general recommendations for a balanced deficit meal plan, which promotes 1-2 lb weight loss per week and includes a negative energy balance of [phone] kcal/d;Understanding recommendations for meals to include 15-35% energy as protein, 25-35% energy from fat, 35-60% energy from carbohydrates, less than 200mg  of dietary cholesterol, 20-35 gm of total fiber daily;Understanding of distribution of calorie intake throughout the day with the consumption of 4-5 meals/snacks    Diabetes Yes    Intervention Provide education about signs/symptoms and action to take for hypo/hyperglycemia.;Provide education about proper nutrition, including hydration, and aerobic/resistive exercise prescription along with prescribed medications to achieve blood glucose in normal ranges: Fasting glucose 65-99 mg/dL    Expected Outcomes Short Term: Participant verbalizes understanding of the signs/symptoms and immediate care of hyper/hypoglycemia, proper foot care and importance of medication, aerobic/resistive exercise and nutrition plan for blood glucose control.;Long Term: Attainment of HbA1C < 7%.    Hypertension Yes    Intervention Provide education on lifestyle modifcations including regular physical activity/exercise, weight management, moderate sodium restriction and increased consumption of fresh fruit, vegetables, and low fat dairy, alcohol moderation, and smoking cessation.;Monitor prescription use compliance.    Expected Outcomes Short Term: Continued assessment and intervention until BP is < 140/75mm HG in hypertensive participants. < 130/1mm HG in hypertensive participants with diabetes, heart failure or chronic kidney disease.;Long Term: Maintenance of blood pressure at goal levels.    Lipids Yes    Intervention Provide education and support for participant on nutrition & aerobic/resistive exercise along with prescribed medications to achieve LDL 70mg , HDL >40mg .    Expected Outcomes Long Term:  Cholesterol controlled with medications as prescribed, with individualized exercise RX and with personalized nutrition plan. Value goals: LDL < 70mg , HDL > 40 mg.;Short Term: Participant states understanding of desired cholesterol values and is compliant with medications prescribed. Participant is following exercise prescription and nutrition guidelines.          Education:Diabetes - Individual verbal and written instruction to review signs/symptoms of diabetes, desired ranges of glucose level fasting, after meals and with exercise. Acknowledge that pre and post exercise glucose checks will be done for 3 sessions at entry of program. Flowsheet Row Cardiac Rehab from 03/16/2024 in [facility] Cardiac and Pulmonary Rehab  Date 03/02/24  Educator [facility]  Instruction Review Code 1- Verbalizes Understanding    Core Components/Risk Factors/Patient Goals Review:    Core Components/Risk Factors/Patient Goals at Discharge (Final Review):    ITP Comments:  ITP Comments     Row Name 03/01/24 1429 03/02/24 1140 03/07/24 1417 03/23/24 1157     ITP Comments Initial phone call completed. Diagnosis can be found in [facility] 12/25/2023. EP Orientation scheduled for 03/02/2024 @ 10:00. Completed and gym orientation for cardiac rehab. Initial ITP created and sent for review to Dr. Oneil Pinal, Medical Director. First full day of exercise!  Patient was oriented to gym and equipment including functions, settings, policies, and procedures.  Patient's individual exercise prescription and treatment plan were reviewed.  All starting workloads were established based on the results of the 6 minute walk test done at initial orientation visit.  The plan for exercise progression was also introduced and progression will be customized based on patient's performance and goals. 30 Day review completed. Medical Director ITP review done, changes made as directed, and  signed approval by Medical Director.       Comments: 30 day review

## 2024-03-24 ENCOUNTER — Encounter: Admitting: *Deleted

## 2024-03-24 DIAGNOSIS — Z951 Presence of aortocoronary bypass graft: Secondary | ICD-10-CM | POA: Diagnosis not present

## 2024-03-24 NOTE — Progress Notes (Signed)
 Daily Session Note  Patient Details  Name: Linda Tucker MRN: [id number] Date of Birth: Feb 17, 1951 Referring Provider:   Flowsheet Row Cardiac Rehab from 03/02/2024 in [facility] Cardiac and Pulmonary Rehab  Referring Provider Dr. Toni Cool    Encounter Date: 03/24/2024  Check In:  Session Check In - 03/24/24 1401       Check-In   Supervising physician immediately available to respond to emergencies See telemetry face sheet for immediately available ER MD    Location [facility]-Cardiac & Pulmonary Rehab    Staff Present Hoy Rodney RN,BSN;Susanne Bice, RN, BSN, CCRP;Joseph Hood RCP,RRT,BSRT;Noah Tickle, BS, Exercise Physiologist    Virtual Visit No    Medication changes reported     No    Fall or balance concerns reported    No    Warm-up and Cool-down Performed on first and last piece of equipment    Resistance Training Performed Yes    VAD Patient? No    PAD/SET Patient? No      Pain Assessment   Currently in Pain? No/denies             Social History   Tobacco Use  Smoking Status Never  Smokeless Tobacco Never    Goals Met:  Independence with exercise equipment Exercise tolerated well No report of concerns or symptoms today Strength training completed today  Goals Unmet:  Not Applicable  Comments: Pt able to follow exercise prescription today without complaint.  Will continue to monitor for progression.    Dr. Oneil Pinal is Medical Director for [facility] Cardiac Rehabilitation.  Dr. Fuad Aleskerov is Medical Director for [facility] Pulmonary Rehabilitation.

## 2024-03-28 ENCOUNTER — Encounter

## 2024-03-30 ENCOUNTER — Encounter: Admitting: *Deleted

## 2024-03-30 DIAGNOSIS — Z951 Presence of aortocoronary bypass graft: Secondary | ICD-10-CM

## 2024-03-30 NOTE — Progress Notes (Signed)
 Daily Session Note  Patient Details  Name: Linda Tucker MRN: [id number] Date of Birth: 1951-03-26 Referring Provider:   Flowsheet Row Cardiac Rehab from 03/02/2024 in [facility] Cardiac and Pulmonary Rehab  Referring Provider Dr. Toni Cool    Encounter Date: 03/30/2024  Check In:  Session Check In - 03/30/24 1403       Check-In   Supervising physician immediately available to respond to emergencies See telemetry face sheet for immediately available ER MD    Location [facility]-Cardiac & Pulmonary Rehab    Staff Present Hoy Rodney RN,BSN;Joseph [facility] Dyane BS, ACSM CEP, Exercise Physiologist;Kelly [facility]    Virtual Visit No    Medication changes reported     No    Warm-up and Cool-down Performed on first and last piece of equipment    Resistance Training Performed Yes    VAD Patient? No    PAD/SET Patient? No      Pain Assessment   Currently in Pain? No/denies             Social History   Tobacco Use  Smoking Status Never  Smokeless Tobacco Never    Goals Met:  Independence with exercise equipment Exercise tolerated well No report of concerns or symptoms today Strength training completed today  Goals Unmet:  Not Applicable  Comments: Pt able to follow exercise prescription today without complaint.  Will continue to monitor for progression.    Dr. Oneil Pinal is Medical Director for [facility] Cardiac Rehabilitation.  Dr. Fuad Aleskerov is Medical Director for [facility] Pulmonary Rehabilitation.

## 2024-03-31 ENCOUNTER — Encounter

## 2024-04-04 ENCOUNTER — Encounter: Admitting: *Deleted

## 2024-04-04 DIAGNOSIS — Z951 Presence of aortocoronary bypass graft: Secondary | ICD-10-CM

## 2024-04-04 NOTE — Progress Notes (Signed)
 Daily Session Note  Patient Details  Name: Linda Tucker MRN: [id number] Date of Birth: [birth year] Referring Provider:   Flowsheet Row Cardiac Rehab from 03/02/2024 in [facility] Cardiac and Pulmonary Rehab  Referring Provider Dr. Toni Cool    Encounter Date: 04/04/2024  Check In:  Session Check In - 04/04/24 1352       Check-In   Supervising physician immediately available to respond to emergencies See telemetry face sheet for immediately available ER MD    Location [facility]-Cardiac & Pulmonary Rehab    Staff Present Hoy Rodney RN,BSN;Joseph [facility] RCP,RRT,BSRT;Margaret Best, MS, Exercise Physiologist;Jason Elnor RDN,LDN    Virtual Visit No    Medication changes reported     No    Fall or balance concerns reported    No    Warm-up and Cool-down Performed on first and last piece of equipment    Resistance Training Performed Yes    VAD Patient? No    PAD/SET Patient? No      Pain Assessment   Currently in Pain? No/denies             Social History   Tobacco Use  Smoking Status Never  Smokeless Tobacco Never    Goals Met:  Independence with exercise equipment Exercise tolerated well No report of concerns or symptoms today Strength training completed today  Goals Unmet:  Not Applicable  Comments: Pt able to follow exercise prescription today without complaint.  Will continue to monitor for progression.    Dr. Oneil Pinal is Medical Director for [facility] Cardiac Rehabilitation.  Dr. Fuad Aleskerov is Medical Director for [facility] Pulmonary Rehabilitation.

## 2024-04-06 ENCOUNTER — Encounter: Admitting: *Deleted

## 2024-04-06 DIAGNOSIS — Z951 Presence of aortocoronary bypass graft: Secondary | ICD-10-CM

## 2024-04-06 NOTE — Progress Notes (Signed)
 Daily Session Note  Patient Details  Name: Linda Tucker MRN: [id number] Date of Birth: 1951/07/02 Referring Provider:   Flowsheet Row Cardiac Rehab from 03/02/2024 in [facility] Cardiac and Pulmonary Rehab  Referring Provider Dr. Toni Cool    Encounter Date: 04/06/2024  Check In:  Session Check In - 04/06/24 1359       Check-In   Supervising physician immediately available to respond to emergencies See telemetry face sheet for immediately available ER MD    Location [facility]-Cardiac & Pulmonary Rehab    Staff Present Hoy Rodney RN,BSN;Kelly Bollinger RN,BSN,MPA;Kelly Dyane BS, ACSM CEP, Exercise Physiologist;Jason Elnor RDN,LDN    Virtual Visit No    Medication changes reported     No    Fall or balance concerns reported    No    Warm-up and Cool-down Performed on first and last piece of equipment    Resistance Training Performed Yes    VAD Patient? No    PAD/SET Patient? No      Pain Assessment   Currently in Pain? No/denies             Social History   Tobacco Use  Smoking Status Never  Smokeless Tobacco Never    Goals Met:  Independence with exercise equipment Exercise tolerated well No report of concerns or symptoms today Strength training completed today  Goals Unmet:  Not Applicable  Comments: Pt able to follow exercise prescription today without complaint.  Will continue to monitor for progression.    Dr. Oneil Pinal is Medical Director for [facility] Cardiac Rehabilitation.  Dr. Fuad Aleskerov is Medical Director for [facility] Pulmonary Rehabilitation.

## 2024-04-07 ENCOUNTER — Encounter

## 2024-04-11 ENCOUNTER — Encounter: Admitting: *Deleted

## 2024-04-11 DIAGNOSIS — Z951 Presence of aortocoronary bypass graft: Secondary | ICD-10-CM

## 2024-04-11 NOTE — Progress Notes (Signed)
 Daily Session Note  Patient Details  Name: Linda Tucker MRN: [id number] Date of Birth: Feb 01, 1951 Referring Provider:   Flowsheet Row Cardiac Rehab from 03/02/2024 in [facility] Cardiac and Pulmonary Rehab  Referring Provider Dr. Toni Cool    Encounter Date: 04/11/2024  Check In:  Session Check In - 04/11/24 1346       Check-In   Supervising physician immediately available to respond to emergencies See telemetry face sheet for immediately available ER MD    Location [facility]-Cardiac & Pulmonary Rehab    Staff Present Hoy Rodney RN,BSN;Margaret Best, MS, Exercise Physiologist;Maxon Conetta BS, Exercise Physiologist;Noah Tickle, BS, Exercise Physiologist    Virtual Visit No    Medication changes reported     No    Fall or balance concerns reported    No    Warm-up and Cool-down Performed on first and last piece of equipment    Resistance Training Performed Yes    VAD Patient? No    PAD/SET Patient? No      Pain Assessment   Currently in Pain? No/denies             Social History   Tobacco Use  Smoking Status Never  Smokeless Tobacco Never    Goals Met:  Independence with exercise equipment Exercise tolerated well No report of concerns or symptoms today Strength training completed today  Goals Unmet:  Not Applicable  Comments: Pt able to follow exercise prescription today without complaint.  Will continue to monitor for progression.    Dr. Oneil Pinal is Medical Director for [facility] Cardiac Rehabilitation.  Dr. Fuad Aleskerov is Medical Director for [facility] Pulmonary Rehabilitation.

## 2024-04-13 ENCOUNTER — Encounter: Admitting: *Deleted

## 2024-04-13 DIAGNOSIS — Z951 Presence of aortocoronary bypass graft: Secondary | ICD-10-CM

## 2024-04-13 NOTE — Progress Notes (Signed)
 Daily Session Note  Patient Details  Name: Linda Tucker MRN: [id number] Date of Birth: 1951/06/27 Referring Provider:   Flowsheet Row Cardiac Rehab from 03/02/2024 in [facility] Cardiac and Pulmonary Rehab  Referring Provider Dr. Toni Cool    Encounter Date: 04/13/2024  Check In:  Session Check In - 04/13/24 1418       Check-In   Supervising physician immediately available to respond to emergencies See telemetry face sheet for immediately available ER MD    Location [facility]-Cardiac & Pulmonary Rehab    Staff Present Hoy Rodney RN,BSN;Kelly Dyane BS, ACSM CEP, Exercise Physiologist;Kelly Bollinger RN,BSN,MPA;Noah Tickle, BS, Exercise Physiologist    Virtual Visit No    Medication changes reported     No    Fall or balance concerns reported    No    Warm-up and Cool-down Performed on first and last piece of equipment    Resistance Training Performed Yes    VAD Patient? No    PAD/SET Patient? No      Pain Assessment   Currently in Pain? No/denies             Social History   Tobacco Use  Smoking Status Never  Smokeless Tobacco Never    Goals Met:  Independence with exercise equipment Exercise tolerated well No report of concerns or symptoms today Strength training completed today  Goals Unmet:  Not Applicable  Comments: Pt able to follow exercise prescription today without complaint.  Will continue to monitor for progression.    Dr. Oneil Pinal is Medical Director for [facility] Cardiac Rehabilitation.  Dr. Fuad Aleskerov is Medical Director for [facility] Pulmonary Rehabilitation.

## 2024-04-14 ENCOUNTER — Encounter: Admitting: *Deleted

## 2024-04-14 DIAGNOSIS — Z951 Presence of aortocoronary bypass graft: Secondary | ICD-10-CM

## 2024-04-14 NOTE — Progress Notes (Signed)
 Daily Session Note  Patient Details  Name: REMMI ARMENTEROS MRN: [id number] Date of Birth: 1951-03-27 Referring Provider:   Flowsheet Row Cardiac Rehab from 03/02/2024 in [facility] Cardiac and Pulmonary Rehab  Referring Provider Dr. Toni Cool    Encounter Date: 04/14/2024  Check In:  Session Check In - 04/14/24 1356       Check-In   Supervising physician immediately available to respond to emergencies See telemetry face sheet for immediately available ER MD    Location [facility]-Cardiac & Pulmonary Rehab    Staff Present Hoy Rodney RN,BSN;Kristen Coble RN,BC,MSN;Maxon Burnell BS, Exercise Physiologist;Noah Tickle, BS, Exercise Physiologist    Virtual Visit No    Medication changes reported     No    Fall or balance concerns reported    No    Warm-up and Cool-down Performed on first and last piece of equipment    Resistance Training Performed Yes    VAD Patient? No    PAD/SET Patient? No      Pain Assessment   Currently in Pain? No/denies             Social History   Tobacco Use  Smoking Status Never  Smokeless Tobacco Never    Goals Met:  Independence with exercise equipment Exercise tolerated well No report of concerns or symptoms today Strength training completed today  Goals Unmet:  Not Applicable  Comments: Pt able to follow exercise prescription today without complaint.  Will continue to monitor for progression.    Dr. Oneil Pinal is Medical Director for [facility] Cardiac Rehabilitation.  Dr. Fuad Aleskerov is Medical Director for [facility] Pulmonary Rehabilitation.

## 2024-04-18 ENCOUNTER — Encounter: Admitting: *Deleted

## 2024-04-18 DIAGNOSIS — Z951 Presence of aortocoronary bypass graft: Secondary | ICD-10-CM | POA: Diagnosis not present

## 2024-04-18 NOTE — Progress Notes (Signed)
 Daily Session Note  Patient Details  Name: Linda Tucker MRN: [id number] Date of Birth: 04-08-1951 Referring Provider:   Flowsheet Row Cardiac Rehab from 03/02/2024 in [facility] Cardiac and Pulmonary Rehab  Referring Provider Dr. Toni Cool    Encounter Date: 04/18/2024  Check In:  Session Check In - 04/18/24 1359       Check-In   Supervising physician immediately available to respond to emergencies See telemetry face sheet for immediately available ER MD    Location [facility]-Cardiac & Pulmonary Rehab    Staff Present Hoy Rodney RN,BSN;Margaret Best, MS, Exercise Physiologist;Kelly Dyane HECKLE, ACSM CEP, Exercise Physiologist;Noah Tickle, BS, Exercise Physiologist    Virtual Visit No    Medication changes reported     No    Fall or balance concerns reported    No    Warm-up and Cool-down Performed on first and last piece of equipment    Resistance Training Performed Yes    VAD Patient? No    PAD/SET Patient? No      Pain Assessment   Currently in Pain? No/denies             Social History   Tobacco Use  Smoking Status Never  Smokeless Tobacco Never    Goals Met:  Independence with exercise equipment Exercise tolerated well No report of concerns or symptoms today Strength training completed today  Goals Unmet:  Not Applicable  Comments: Pt able to follow exercise prescription today without complaint.  Will continue to monitor for progression.    Dr. Oneil Pinal is Medical Director for [facility] Cardiac Rehabilitation.  Dr. Fuad Aleskerov is Medical Director for [facility] Pulmonary Rehabilitation.

## 2024-04-20 ENCOUNTER — Encounter: Admitting: *Deleted

## 2024-04-20 DIAGNOSIS — Z951 Presence of aortocoronary bypass graft: Secondary | ICD-10-CM

## 2024-04-20 NOTE — Progress Notes (Signed)
 Cardiac Individual Treatment Plan  Patient Details  Name: Linda Tucker MRN: [id number] Date of Birth: 03/09/1951 Referring Provider:   Flowsheet Row Cardiac Rehab from 03/02/2024 in [facility] Cardiac and Pulmonary Rehab  Referring Provider Dr. Toni Cool    Initial Encounter Date:  Flowsheet Row Cardiac Rehab from 03/02/2024 in [facility] Cardiac and Pulmonary Rehab  Date 03/02/24    Visit Diagnosis: S/P CABG x 4  Patient's Home Medications on Admission:  Current Outpatient Medications:    apixaban (ELIQUIS) 5 MG TABS tablet, Take 5 mg by mouth 2 (two) times daily., Disp: , Rfl:    atorvastatin (LIPITOR) 40 MG tablet, Take 40 mg by mouth daily., Disp: , Rfl:    benzonatate  (TESSALON ) 200 MG capsule, Take 1 capsule (200 mg total) by mouth 3 (three) times daily as needed for cough. (Patient not taking: Reported on 03/01/2024), Disp: 30 capsule, Rfl: 0   Chromium 200 MCG CAPS, Take 200 mcg by mouth daily., Disp: , Rfl:    clopidogrel (PLAVIX) 75 MG tablet, Take 75 mg by mouth daily., Disp: , Rfl:    co-enzyme Q-10 30 MG capsule, Take by mouth. (Patient not taking: Reported on 03/01/2024), Disp: , Rfl:    cyanocobalamin (VITAMIN B12) 1000 MCG tablet, Take 1,000 mcg by mouth daily., Disp: , Rfl:    diltiazem (CARDIZEM CD) 180 MG 24 hr capsule, Take 120 mg by mouth daily., Disp: , Rfl:    fexofenadine (ALLEGRA ODT) 30 MG disintegrating tablet, Take 30 mg by mouth daily., Disp: , Rfl:    fluticasone  (FLONASE ) 50 MCG/ACT nasal spray, Place 2 sprays into both nostrils daily. (Patient not taking: Reported on 03/01/2024), Disp: 16 g, Rfl: 0   gabapentin (NEURONTIN) 300 MG capsule, Take 900 mg by mouth 2 (two) times daily., Disp: , Rfl:    hydrochlorothiazide (HYDRODIURIL) 25 MG tablet, Take 25 mg by mouth daily. (Patient not taking: Reported on 03/01/2024), Disp: , Rfl:    insulin glargine (LANTUS) 100 UNIT/ML injection, Inject 60 Units into the skin daily., Disp: , Rfl:    insulin regular (NOVOLIN R) 100  units/mL injection, Inject into the skin 3 (three) times daily before meals., Disp: , Rfl:    lisinopril (ZESTRIL) 20 MG tablet, Take 10 mg by mouth daily., Disp: , Rfl:    Magnesium Glycinate 100 MG CAPS, Take 300 mg by mouth daily., Disp: , Rfl:    Melatonin 5 MG CAPS, Take by mouth., Disp: , Rfl:    metFORMIN (GLUCOPHAGE) 850 MG tablet, Take 850 mg by mouth 2 (two) times daily with a meal., Disp: , Rfl:    metoprolol succinate (TOPROL-XL) 25 MG 24 hr tablet, Take 25 mg by mouth daily., Disp: , Rfl:    Multiple Vitamin (MULTI-VITAMIN) tablet, Take 1 tablet by mouth daily., Disp: , Rfl:    simvastatin (ZOCOR) 40 MG tablet, Take 40 mg by mouth daily. (Patient not taking: Reported on 03/01/2024), Disp: , Rfl:    Vitamin E 45 MG (100 UNIT) CAPS, Take by mouth. (Patient not taking: Reported on 03/01/2024), Disp: , Rfl:   Past Medical History: Past Medical History:  Diagnosis Date   Chronic back pain    Chronic hip pain    High cholesterol    Hypertension    Neuropathy    Type 2 diabetes mellitus (HCC)     Tobacco Use: Social History   Tobacco Use  Smoking Status Never  Smokeless Tobacco Never    Labs: Review Flowsheet  No data to display           Exercise Target Goals: Exercise Program Goal: Individual exercise prescription set using results from initial 6 min walk test and THRR while considering  patient's activity barriers and safety.   Exercise Prescription Goal: Initial exercise prescription builds to 30-45 minutes a day of aerobic activity, 2-3 days per week.  Home exercise guidelines will be given to patient during program as part of exercise prescription that the participant will acknowledge.   Education: Aerobic Exercise: - Group verbal and visual presentation on the components of exercise prescription. Introduces F.I.T.T principle from ACSM for exercise prescriptions.  Reviews F.I.T.T. principles of aerobic exercise including progression. Written material  given at graduation.   Education: Resistance Exercise: - Group verbal and visual presentation on the components of exercise prescription. Introduces F.I.T.T principle from ACSM for exercise prescriptions  Reviews F.I.T.T. principles of resistance exercise including progression. Written material given at graduation.    Education: Exercise & Equipment Safety: - Individual verbal instruction and demonstration of equipment use and safety with use of the equipment. Flowsheet Row Cardiac Rehab from 04/13/2024 in [facility] Cardiac and Pulmonary Rehab  Date 03/02/24  Educator [facility]  Instruction Review Code 1- Verbalizes Understanding    Education: Exercise Physiology & General Exercise Guidelines: - Group verbal and written instruction with models to review the exercise physiology of the cardiovascular system and associated critical values. Provides general exercise guidelines with specific guidelines to those with heart or lung disease.  Flowsheet Row Cardiac Rehab from 04/13/2024 in [facility] Cardiac and Pulmonary Rehab  Date 04/13/24  Educator [facility]  Instruction Review Code 1- [employer] Understanding    Education: Flexibility, Balance, Mind/Body Relaxation: - Group verbal and visual presentation with interactive activity on the components of exercise prescription. Introduces F.I.T.T principle from ACSM for exercise prescriptions. Reviews F.I.T.T. principles of flexibility and balance exercise training including progression. Also discusses the mind body connection.  Reviews various relaxation techniques to help reduce and manage stress (i.e. Deep breathing, progressive muscle relaxation, and visualization). Balance handout provided to take home. Written material given at graduation.   Activity Barriers & Risk Stratification:  Activity Barriers & Cardiac Risk Stratification - 03/01/24 1453       Activity Barriers & Cardiac Risk Stratification   Activity Barriers History of Falls;Assistive Device    Cardiac  Risk Stratification High          6 Minute Walk:  6 Minute Walk     Row Name 03/02/24 1141         6 Minute Walk   Phase Initial     Distance 455 feet     Walk Time 4.22 minutes     # of Rest Breaks 3     MPH 1.18     METS 1     RPE 13     Perceived Dyspnea  1     VO2 Peak 2.7     Symptoms Yes (comment)     Comments Hip pain bilateral, lower back discomfort     Resting HR 82 bpm     Resting BP 122/60     Resting Oxygen Saturation  97 %     Exercise Oxygen Saturation  during 6 min walk 99 %     Max Ex. HR 96 bpm     Max Ex. BP 144/62     2 Minute Post BP 130/62        Oxygen Initial Assessment:   Oxygen Re-Evaluation:  Oxygen Discharge (Final Oxygen Re-Evaluation):   Initial Exercise Prescription:  Initial Exercise Prescription - 03/02/24 1300       Date of Initial Exercise RX and Referring Provider   Date 03/02/24    Referring Provider Dr. Toni Cool      Oxygen   Maintain Oxygen Saturation 88% or higher      Recumbant Bike   Level 1    RPM 50    Watts 25    Minutes 15    METs 2      NuStep   Level 1    SPM 80    Minutes 15    METs 2      Biostep-RELP   Level 1    SPM 50    Minutes 15    METs 2      Track   Laps 16    Minutes 15    METs 2      Intensity   THRR 40-80% of Max Heartrate 107-133    Ratings of Perceived Exertion 11-13    Perceived Dyspnea 0-4      Progression   Progression Continue to progress workloads to maintain intensity without signs/symptoms of physical distress.      Resistance Training   Training Prescription Yes    Weight 2lb    Reps 10-15          Perform Capillary Blood Glucose checks as needed.  Exercise Prescription Changes:   Exercise Prescription Changes     Row Name 03/02/24 1300 03/23/24 1300 04/07/24 1100 04/19/24 1500       Response to Exercise   Blood Pressure (Admit) 122/60 144/68 122/68 138/76    Blood Pressure (Exercise) 144/62 160/78 156/86 144/70    Blood Pressure (Exit)  130/62 126/62 132/72 150/60    Heart Rate (Admit) 82 bpm 87 bpm 88 bpm 87 bpm    Heart Rate (Exercise) 96 bpm 102 bpm 101 bpm 108 bpm    Heart Rate (Exit) 86 bpm 85 bpm 87 bpm 86 bpm    Oxygen Saturation (Admit) 97 % -- -- --    Oxygen Saturation (Exercise) 99 % -- -- --    Oxygen Saturation (Exit) 99 % -- -- --    Rating of Perceived Exertion (Exercise) 13 15 15 15     Perceived Dyspnea (Exercise) 1 0 -- --    Symptoms Bilateral hip pain, back discomfort shin pain none none    Comments results first 2 weeks of exercise -- --    Duration -- Progress to 30 minutes of  aerobic without signs/symptoms of physical distress Progress to 30 minutes of  aerobic without signs/symptoms of physical distress Progress to 30 minutes of  aerobic without signs/symptoms of physical distress    Intensity -- THRR unchanged THRR unchanged THRR unchanged      Progression   Progression -- Continue to progress workloads to maintain intensity without signs/symptoms of physical distress. Continue to progress workloads to maintain intensity without signs/symptoms of physical distress. Continue to progress workloads to maintain intensity without signs/symptoms of physical distress.    Average METs -- 1.64 1.53 1.76      Resistance Training   Training Prescription -- Yes Yes Yes    Weight -- 2lb 2lb 2lb    Reps -- 10-15 10-15 10-15      Interval Training   Interval Training -- No No No      NuStep   Level -- 1 1 --    Minutes --  15 15 --    METs -- 2.1 1.7 --      T5 Nustep   Level -- 1 -- --    Minutes -- 15 -- --    METs -- 1.8 -- --      Biostep-RELP   Level -- 1 -- --    Minutes -- 15 -- --    METs -- 2 -- --      Track   Laps -- 10 10 18     Minutes -- 15 15 30     METs -- 1.54 1.54 1.98      Oxygen   Maintain Oxygen Saturation -- 88% or higher 88% or higher 88% or higher       Exercise Comments:   Exercise Comments     Row Name 03/07/24 1418           Exercise Comments First  full day of exercise!  Patient was oriented to gym and equipment including functions, settings, policies, and procedures.  Patient's individual exercise prescription and treatment plan were reviewed.  All starting workloads were established based on the results of the 6 minute walk test done at initial orientation visit.  The plan for exercise progression was also introduced and progression will be customized based on patient's performance and goals.          Exercise Goals and Review:   Exercise Goals     Row Name 03/02/24 1309             Exercise Goals   Increase Physical Activity Yes       Intervention Provide advice, education, support and counseling about physical activity/exercise needs.;Develop an individualized exercise prescription for aerobic and resistive training based on initial evaluation findings, risk stratification, comorbidities and participant's personal goals.       Expected Outcomes Short Term: Attend rehab on a regular basis to increase amount of physical activity.;Long Term: Add in home exercise to make exercise part of routine and to increase amount of physical activity.;Long Term: Exercising regularly at least 3-5 days a week.       Increase Strength and Stamina Yes       Intervention Provide advice, education, support and counseling about physical activity/exercise needs.;Develop an individualized exercise prescription for aerobic and resistive training based on initial evaluation findings, risk stratification, comorbidities and participant's personal goals.       Expected Outcomes Long Term: Improve cardiorespiratory fitness, muscular endurance and strength as measured by increased METs and functional capacity ( );Short Term: Perform resistance training exercises routinely during rehab and add in resistance training at home;Short Term: Increase workloads from initial exercise prescription for resistance, speed, and METs.       Able to understand and use rate of  perceived exertion (RPE) scale Yes       Intervention Provide education and explanation on how to use RPE scale       Expected Outcomes Short Term: Able to use RPE daily in rehab to express subjective intensity level;Long Term:  Able to use RPE to guide intensity level when exercising independently       Able to understand and use Dyspnea scale Yes       Intervention Provide education and explanation on how to use Dyspnea scale       Expected Outcomes Short Term: Able to use Dyspnea scale daily in rehab to express subjective sense of shortness of breath during exertion;Long Term: Able to use Dyspnea scale to guide intensity level when exercising independently  Knowledge and understanding of Target Heart Rate Range (THRR) Yes       Intervention Provide education and explanation of THRR including how the numbers were predicted and where they are located for reference       Expected Outcomes Short Term: Able to state/look up THRR;Short Term: Able to use daily as guideline for intensity in rehab;Long Term: Able to use THRR to govern intensity when exercising independently       Able to check pulse independently Yes       Intervention Provide education and demonstration on how to check pulse in carotid and radial arteries.;Review the importance of being able to check your own pulse for safety during independent exercise       Expected Outcomes Short Term: Able to explain why pulse checking is important during independent exercise;Long Term: Able to check pulse independently and accurately       Understanding of Exercise Prescription Yes       Intervention Provide education, explanation, and written materials on patient's individual exercise prescription       Expected Outcomes Long Term: Able to explain home exercise prescription to exercise independently;Short Term: Able to explain program exercise prescription          Exercise Goals Re-Evaluation :  Exercise Goals Re-Evaluation     Row Name  03/07/24 1420 03/23/24 1355 04/07/24 1104 04/18/24 1354 04/19/24 1502     Exercise Goal Re-Evaluation   Exercise Goals Review Increase Physical Activity;Able to understand and use rate of perceived exertion (RPE) scale;Knowledge and understanding of Target Heart Rate Range (THRR);Understanding of Exercise Prescription;Increase Strength and Stamina;Able to check pulse independently Increase Physical Activity;Increase Strength and Stamina;Understanding of Exercise Prescription Increase Physical Activity;Increase Strength and Stamina;Understanding of Exercise Prescription Increase Physical Activity;Increase Strength and Stamina;Understanding of Exercise Prescription Increase Physical Activity;Increase Strength and Stamina;Understanding of Exercise Prescription   Comments Reviewed RPE and dyspnea scale, THR and program prescription with pt today.  Pt voiced understanding and was given a copy of goals to take home. Edee is off to a great start in the program. She has been able to use both the T4 and T5 nustep at level 1. She was also able to walk 10 laps on the track. We will continue to monitor her progress in the program. Akasha is doing well in rehab. She maintained level 1 on the T4 nustep. She also maintained walking 10 laps on the track. We will continue to monitor her progress in the program. Daryan is doing well here at rehab, she is not doing much at home but plans to do some water aerobics once she graduates from rehab. Charday is doing well in rehab. She increased her laps on the track to 18 laps in 30 minutes with breaks. We will continue to monitor her progress in the program.   Expected Outcomes Short: Use RPE daily to regulate intensity.  Long: Follow program prescription in THR. Short: Continue to follow exercise prescription. Long: Continue exercise to improve strength and stamina. Short: Try level 2 on the T4 nustep. Long: Continue exercise to improve strength and stamina. STG: increase workload here  at rehab. LTG: Continue to exercise to strength and stamina Short: Continue to progressively increase laps on the track. Long: Continue exercise to improve strength and stamina.      Discharge Exercise Prescription (Final Exercise Prescription Changes):  Exercise Prescription Changes - 04/19/24 1500       Response to Exercise   Blood Pressure (Admit) 138/76  Blood Pressure (Exercise) 144/70    Blood Pressure (Exit) 150/60    Heart Rate (Admit) 87 bpm    Heart Rate (Exercise) 108 bpm    Heart Rate (Exit) 86 bpm    Rating of Perceived Exertion (Exercise) 15    Symptoms none    Duration Progress to 30 minutes of  aerobic without signs/symptoms of physical distress    Intensity THRR unchanged      Progression   Progression Continue to progress workloads to maintain intensity without signs/symptoms of physical distress.    Average METs 1.76      Resistance Training   Training Prescription Yes    Weight 2lb    Reps 10-15      Interval Training   Interval Training No      Track   Laps 18    Minutes 30    METs 1.98      Oxygen   Maintain Oxygen Saturation 88% or higher          Nutrition:  Target Goals: Understanding of nutrition guidelines, daily intake of sodium 1500mg , cholesterol 200mg , calories 30% from fat and 7% or less from saturated fats, daily to have 5 or more servings of fruits and vegetables.  Education: All About Nutrition: -Group instruction provided by verbal, written material, interactive activities, discussions, models, and posters to present general guidelines for heart healthy nutrition including fat, fiber, MyPlate, the role of sodium in heart healthy nutrition, utilization of the nutrition label, and utilization of this knowledge for meal planning. Follow up email sent as well. Written material given at graduation.   Biometrics:  Pre Biometrics - 03/02/24 1310       Pre Biometrics   Height 5' 3.4 (1.61 m)    Weight 202 lb 12.8 oz (92 kg)     Waist Circumference 46.5 inches    Hip Circumference 41 inches    Waist to Hip Ratio 1.13 %    BMI (Calculated) 35.49    Single Leg Stand 0 seconds           Nutrition Therapy Plan and Nutrition Goals:   Nutrition Assessments:  MEDIFICTS Score Key: >=70 Need to make dietary changes  40-70 Heart Healthy Diet <= 40 Therapeutic Level Cholesterol Diet  Flowsheet Row Cardiac Rehab from 03/07/2024 in [facility] Cardiac and Pulmonary Rehab  Picture Your Plate Total Score on Admission 52   Picture Your Plate Scores: <59 Unhealthy dietary pattern with much room for improvement. 41-50 Dietary pattern unlikely to meet recommendations for good health and room for improvement. 51-60 More healthful dietary pattern, with some room for improvement.  >60 Healthy dietary pattern, although there may be some specific behaviors that could be improved.    Nutrition Goals Re-Evaluation:  Nutrition Goals Re-Evaluation     Row Name 04/18/24 1358             Goals   Comment Inga reports she has been drinking more water lately. She is eating better since starting rehab. Her husband is a retired Charity fundraiser and makes sure she is eating well.       Expected Outcome STG: Drink 64oz of water and eat 3 times per day. LTG: Follow a heart health diet          Nutrition Goals Discharge (Final Nutrition Goals Re-Evaluation):  Nutrition Goals Re-Evaluation - 04/18/24 1358       Goals   Comment Sherell reports she has been drinking more water lately. She is eating better since starting  rehab. Her husband is a retired Charity fundraiser and makes sure she is eating well.    Expected Outcome STG: Drink 64oz of water and eat 3 times per day. LTG: Follow a heart health diet          Psychosocial: Target Goals: Acknowledge presence or absence of significant depression and/or stress, maximize coping skills, provide positive support system. Participant is able to verbalize types and ability to use techniques and skills needed for  reducing stress and depression.   Education: Stress, Anxiety, and Depression - Group verbal and visual presentation to define topics covered.  Reviews how body is impacted by stress, anxiety, and depression.  Also discusses healthy ways to reduce stress and to treat/manage anxiety and depression.  Written material given at graduation.   Education: Sleep Hygiene -Provides group verbal and written instruction about how sleep can affect your health.  Define sleep hygiene, discuss sleep cycles and impact of sleep habits. Review good sleep hygiene tips.    Initial Review & Psychosocial Screening:  Initial Psych Review & Screening - 03/01/24 1432       Initial Review   Current issues with Current Sleep Concerns   states she has to get up every 2-3 hours to use the restroom     Family Dynamics   Good Support System? Yes   husband and children/their families     Barriers   Psychosocial barriers to participate in program There are no identifiable barriers or psychosocial needs.;The patient should benefit from training in stress management and relaxation.      Screening Interventions   Interventions Encouraged to exercise          Quality of Life Scores:   Quality of Life - 03/08/24 1552       Quality of Life   Select Quality of Life      Quality of Life Scores   Health/Function Pre 26.1 %    Socioeconomic Pre 27.69 %    Psych/Spiritual Pre 28.57 %    Family Pre 29.5 %    GLOBAL Pre 27.44 %         Scores of 19 and below usually indicate a poorer quality of life in these areas.  A difference of  2-3 points is a clinically meaningful difference.  A difference of 2-3 points in the total score of the Quality of Life Index has been associated with significant improvement in overall quality of life, self-image, physical symptoms, and general health in studies assessing change in quality of life.  PHQ-9: Review Flowsheet       04/18/2024 03/02/2024  Depression screen PHQ 2/9   Decreased Interest 1 1  Down, Depressed, Hopeless 0 0  PHQ - 2 Score 1 1  Altered sleeping 1 1  Tired, decreased energy 2 2  Change in appetite 1 2  Feeling bad or failure about yourself  0 0  Trouble concentrating 0 0  Moving slowly or fidgety/restless 0 0  Suicidal thoughts 0 0  PHQ-9 Score 5 6  Difficult doing work/chores Somewhat difficult Somewhat difficult   Interpretation of Total Score  Total Score Depression Severity:  1-4 = Minimal depression, 5-9 = Mild depression, 10-14 = Moderate depression, 15-19 = Moderately severe depression, 20-27 = Severe depression   Psychosocial Evaluation and Intervention:  Psychosocial Evaluation - 03/01/24 1443       Psychosocial Evaluation & Interventions   Comments 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 She stated that her blood pressure is well managed  and her husband has been monitoring it twice daily since the doctor recently changed her BP medications. Patient has diabetes type 2 and takes insulin injections. Patient stated she wears a monitor on her arm to assist with blood sugar control and her last A1C was 7% of which she was pleased. Patient stated she has been working to lose weight and has lost some weight slowly and safely. Patient stated her sleep is often interrupted as she has to go to the restroom every 2-3 hours. Patient's oldest daughter and husband were involved in the initial call and patient stated she has a good support system of her family and does not note any issues with stress or depression. 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 She also uses a cane or walker, and wheelchair at times, to mobilize outside of the home. She states she prefers visual learning aids but is able to read.    Expected Outcomes ST: attend cardiac rehab for education and exercise; LT: develop and maintain positive self-care habits          Psychosocial Re-Evaluation:  Psychosocial  Re-Evaluation     Row Name 04/18/24 1355             Psychosocial Re-Evaluation   Current issues with Current Sleep Concerns       Comments Clora reports she doesnt sleep well, wakes up in middle of night to use bathroom. Then she sometimes will struggle to go back to sleep. She met with a Dr today and got a sleeping pill. She denies any anxiety, stress or depression at this time.       Expected Outcomes STG: try sleeping medication, focus on good sleep. LTG: Achieve and maintain a positive outlook on health and daily life       Interventions Encouraged to attend Cardiac Rehabilitation for the exercise       Continue Psychosocial Services  Follow up required by staff          Psychosocial Discharge (Final Psychosocial Re-Evaluation):  Psychosocial Re-Evaluation - 04/18/24 1355       Psychosocial Re-Evaluation   Current issues with Current Sleep Concerns    Comments Angenette reports she doesnt sleep well, wakes up in middle of night to use bathroom. Then she sometimes will struggle to go back to sleep. She met with a Dr today and got a sleeping pill. She denies any anxiety, stress or depression at this time.    Expected Outcomes STG: try sleeping medication, focus on good sleep. LTG: Achieve and maintain a positive outlook on health and daily life    Interventions Encouraged to attend Cardiac Rehabilitation for the exercise    Continue Psychosocial Services  Follow up required by staff          Vocational Rehabilitation: Provide vocational rehab assistance to qualifying candidates.   Vocational Rehab Evaluation & Intervention:  Vocational Rehab - 03/01/24 1434       Initial Vocational Rehab Evaluation & Intervention   Assessment shows need for Vocational Rehabilitation No          Education: Education Goals: Education classes will be provided on a variety of topics geared toward better understanding of heart health and risk factor modification. Participant will state  understanding/return demonstration of topics presented as noted by education test scores.  Learning Barriers/Preferences:  Learning Barriers/Preferences - 03/01/24 1431       Learning Barriers/Preferences   Learning Barriers Hearing   wears  hearing aids   Learning Preferences Individual Instruction;Verbal Instruction;Video          General Cardiac Education Topics:  AED/CPR: - Group verbal and written instruction with the use of models to demonstrate the basic use of the AED with the basic ABC's of resuscitation.   Anatomy and Cardiac Procedures: - Group verbal and visual presentation and models provide information about basic cardiac anatomy and function. Reviews the testing methods done to diagnose heart disease and the outcomes of the test results. Describes the treatment choices: Medical Management, Angioplasty, or Coronary Bypass Surgery for treating various heart conditions including Myocardial Infarction, Angina, Valve Disease, and Cardiac Arrhythmias.  Written material given at graduation.   Medication Safety: - Group verbal and visual instruction to review commonly prescribed medications for heart and lung disease. Reviews the medication, class of the drug, and side effects. Includes the steps to properly store meds and maintain the prescription regimen.  Written material given at graduation. Flowsheet Row Cardiac Rehab from 04/13/2024 in [facility] Cardiac and Pulmonary Rehab  Date 03/16/24  Educator sb  Instruction Review Code 1- Verbalizes Understanding    Intimacy: - Group verbal instruction through game format to discuss how heart and lung disease can affect sexual intimacy. Written material given at graduation..   Know Your Numbers and Heart Failure: - Group verbal and visual instruction to discuss disease risk factors for cardiac and pulmonary disease and treatment options.  Reviews associated critical values for Overweight/Obesity, Hypertension, Cholesterol, and  Diabetes.  Discusses basics of heart failure: signs/symptoms and treatments.  Introduces Heart Failure Zone chart for action plan for heart failure.  Written material given at graduation.   Infection Prevention: - Provides verbal and written material to individual with discussion of infection control including proper hand washing and proper equipment cleaning during exercise session. Flowsheet Row Cardiac Rehab from 04/13/2024 in [facility] Cardiac and Pulmonary Rehab  Date 03/02/24  Educator [facility]  Instruction Review Code 1- Verbalizes Understanding    Falls Prevention: - Provides verbal and written material to individual with discussion of falls prevention and safety. Flowsheet Row Cardiac Rehab from 04/13/2024 in [facility] Cardiac and Pulmonary Rehab  Date 03/02/24  Educator [facility]  Instruction Review Code 1- Verbalizes Understanding    Other: -Provides group and verbal instruction on various topics (see comments)   Knowledge Questionnaire Score:   Core Components/Risk Factors/Patient Goals at Admission:  Personal Goals and Risk Factors at Admission - 03/01/24 1430       Core Components/Risk Factors/Patient Goals on Admission    Weight Management Yes    Intervention Weight Management/Obesity: Establish reasonable short term and long term weight goals.;Weight Management: Provide education and appropriate resources to help participant work on and attain dietary goals.;Obesity: Provide education and appropriate resources to help participant work on and attain dietary goals.    Expected Outcomes Short Term: Continue to assess and modify interventions until short term weight is achieved;Long Term: Adherence to nutrition and physical activity/exercise program aimed toward attainment of established weight goal;Weight Loss: Understanding of general recommendations for a balanced deficit meal plan, which promotes 1-2 lb weight loss per week and includes a negative energy balance of [id number]  kcal/d;Understanding recommendations for meals to include 15-35% energy as protein, 25-35% energy from fat, 35-60% energy from carbohydrates, less than 200mg  of dietary cholesterol, 20-35 gm of total fiber daily;Understanding of distribution of calorie intake throughout the day with the consumption of 4-5 meals/snacks    Diabetes Yes    Intervention Provide education about  signs/symptoms and action to take for hypo/hyperglycemia.;Provide education about proper nutrition, including hydration, and aerobic/resistive exercise prescription along with prescribed medications to achieve blood glucose in normal ranges: Fasting glucose 65-99 mg/dL    Expected Outcomes Short Term: Participant verbalizes understanding of the signs/symptoms and immediate care of hyper/hypoglycemia, proper foot care and importance of medication, aerobic/resistive exercise and nutrition plan for blood glucose control.;Long Term: Attainment of HbA1C < 7%.    Hypertension Yes    Intervention Provide education on lifestyle modifcations including regular physical activity/exercise, weight management, moderate sodium restriction and increased consumption of fresh fruit, vegetables, and low fat dairy, alcohol moderation, and smoking cessation.;Monitor prescription use compliance.    Expected Outcomes Short Term: Continued assessment and intervention until BP is < 140/76mm HG in hypertensive participants. < 130/24mm HG in hypertensive participants with diabetes, heart failure or chronic kidney disease.;Long Term: Maintenance of blood pressure at goal levels.    Lipids Yes    Intervention Provide education and support for participant on nutrition & aerobic/resistive exercise along with prescribed medications to achieve LDL 70mg , HDL >40mg .    Expected Outcomes Long Term: Cholesterol controlled with medications as prescribed, with individualized exercise RX and with personalized nutrition plan. Value goals: LDL < 70mg , HDL > 40 mg.;Short Term:  Participant states understanding of desired cholesterol values and is compliant with medications prescribed. Participant is following exercise prescription and nutrition guidelines.          Education:Diabetes - Individual verbal and written instruction to review signs/symptoms of diabetes, desired ranges of glucose level fasting, after meals and with exercise. Acknowledge that pre and post exercise glucose checks will be done for 3 sessions at entry of program. Flowsheet Row Cardiac Rehab from 04/13/2024 in [facility] Cardiac and Pulmonary Rehab  Date 03/02/24  Educator [facility]  Instruction Review Code 1- Verbalizes Understanding    Core Components/Risk Factors/Patient Goals Review:   Goals and Risk Factor Review     Row Name 04/18/24 1400             Core Components/Risk Factors/Patient Goals Review   Personal Goals Review Hypertension       Review Annalia's husband checks her BP twice a day, she reports it matches closely with the reading taken here at rehab.       Expected Outcomes STG: Continue to check BP at home. LTG: Manage risk factors independently          Core Components/Risk Factors/Patient Goals at Discharge (Final Review):   Goals and Risk Factor Review - 04/18/24 1400       Core Components/Risk Factors/Patient Goals Review   Personal Goals Review Hypertension    Review Colton's husband checks her BP twice a day, she reports it matches closely with the reading taken here at rehab.    Expected Outcomes STG: Continue to check BP at home. LTG: Manage risk factors independently          ITP Comments:  ITP Comments     Row Name 03/01/24 1429 03/02/24 1140 03/07/24 1417 03/23/24 1157 04/20/24 0845   ITP Comments Initial phone call completed. Diagnosis can be found in [facility] 12/25/2023. EP Orientation scheduled for 03/02/2024 @ 10:00. Completed and gym orientation for cardiac rehab. Initial ITP created and sent for review to Dr. Oneil Pinal, Medical Director. First full day  of exercise!  Patient was oriented to gym and equipment including functions, settings, policies, and procedures.  Patient's individual exercise prescription and treatment plan were reviewed.  All starting workloads  were established based on the results of the 6 minute walk test done at initial orientation visit.  The plan for exercise progression was also introduced and progression will be customized based on patient's performance and goals. 30 Day review completed. Medical Director ITP review done, changes made as directed, and signed approval by Medical Director. 30 Day review completed. Medical Director ITP review done, changes made as directed, and signed approval by Medical Director.      Comments: 30 day review

## 2024-04-20 NOTE — Progress Notes (Signed)
 Daily Session Note  Patient Details  Name: OVIE EASTEP MRN: [id number] Date of Birth: 1950/10/08 Referring Provider:   Flowsheet Row Cardiac Rehab from 03/02/2024 in [facility] Cardiac and Pulmonary Rehab  Referring Provider Dr. Toni Cool    Encounter Date: 04/20/2024  Check In:  Session Check In - 04/20/24 1427       Check-In   Supervising physician immediately available to respond to emergencies See telemetry face sheet for immediately available ER MD    Location [facility]-Cardiac & Pulmonary Rehab    Staff Present Burnard Davenport [facility] Dyane BS, ACSM CEP, Exercise Physiologist;Noah Tickle, BS, Exercise Physiologist;Meredith Tressa RN,BSN    Virtual Visit No    Medication changes reported     No    Fall or balance concerns reported    No    Warm-up and Cool-down Performed on first and last piece of equipment    Resistance Training Performed Yes    VAD Patient? No    PAD/SET Patient? No      Pain Assessment   Currently in Pain? No/denies    Multiple Pain Sites No             Social History   Tobacco Use  Smoking Status Never  Smokeless Tobacco Never    Goals Met:  Independence with exercise equipment Exercise tolerated well Personal goals reviewed No report of concerns or symptoms today Strength training completed today  Goals Unmet:  Not Applicable  Comments: Pt able to follow exercise prescription today without complaint.  Will continue to monitor for progression.   Reviewed home exercise with pt today.  Pt plans to use an elliptical machine at home and look into joining the sports plex and using the pool for exercise.  Reviewed THR, pulse, RPE, sign and symptoms, pulse oximetery and when to call 911 or MD.  Also discussed weather considerations and indoor options.  Pt voiced understanding.   Dr. Oneil Pinal is Medical Director for [facility] Cardiac Rehabilitation.  Dr. Fuad Aleskerov is Medical Director for [facility] Pulmonary Rehabilitation.

## 2024-04-21 ENCOUNTER — Encounter

## 2024-04-25 ENCOUNTER — Encounter: Attending: Cardiology | Admitting: *Deleted

## 2024-04-25 DIAGNOSIS — Z48812 Encounter for surgical aftercare following surgery on the circulatory system: Secondary | ICD-10-CM | POA: Diagnosis not present

## 2024-04-25 DIAGNOSIS — Z951 Presence of aortocoronary bypass graft: Secondary | ICD-10-CM | POA: Diagnosis present

## 2024-04-25 NOTE — Progress Notes (Signed)
 Daily Session Note  Patient Details  Name: Linda Tucker MRN: [id number] Date of Birth: Feb 18, 1951 Referring Provider:   Flowsheet Row Cardiac Rehab from 03/02/2024 in [facility] Cardiac and Pulmonary Rehab  Referring Provider Dr. Toni Cool    Encounter Date: 04/25/2024  Check In:  Session Check In - 04/25/24 1421       Check-In   Supervising physician immediately available to respond to emergencies See telemetry face sheet for immediately available ER MD    Location [facility]-Cardiac & Pulmonary Rehab    Staff Present Rollene Paterson, MS, Exercise Physiologist;Laureen Delores, BS, RRT, CPFT;Jaqwon Manfred Tressa RN,BSN;Noah Tickle, BS, Exercise Physiologist    Virtual Visit No    Medication changes reported     No    Fall or balance concerns reported    No    Warm-up and Cool-down Performed on first and last piece of equipment    Resistance Training Performed Yes    VAD Patient? No    PAD/SET Patient? No      Pain Assessment   Currently in Pain? No/denies             Social History   Tobacco Use  Smoking Status Never  Smokeless Tobacco Never    Goals Met:  Independence with exercise equipment Exercise tolerated well No report of concerns or symptoms today Strength training completed today  Goals Unmet:  Not Applicable  Comments: Pt able to follow exercise prescription today without complaint.  Will continue to monitor for progression.    Dr. Oneil Pinal is Medical Director for [facility] Cardiac Rehabilitation.  Dr. Fuad Aleskerov is Medical Director for [facility] Pulmonary Rehabilitation.

## 2024-04-27 ENCOUNTER — Encounter: Admitting: *Deleted

## 2024-04-27 DIAGNOSIS — Z48812 Encounter for surgical aftercare following surgery on the circulatory system: Secondary | ICD-10-CM | POA: Diagnosis not present

## 2024-04-27 DIAGNOSIS — Z951 Presence of aortocoronary bypass graft: Secondary | ICD-10-CM

## 2024-04-27 NOTE — Progress Notes (Signed)
 Daily Session Note  Patient Details  Name: Linda Tucker MRN: [id number] Date of Birth: Apr 14, 1951 Referring Provider:   Flowsheet Row Cardiac Rehab from 03/02/2024 in [facility] Cardiac and Pulmonary Rehab  Referring Provider Dr. Toni Cool    Encounter Date: 04/27/2024  Check In:  Session Check In - 04/27/24 1424       Check-In   Supervising physician immediately available to respond to emergencies See telemetry face sheet for immediately available ER MD    Location [facility]-Cardiac & Pulmonary Rehab    Staff Present Burnard Davenport RN,BSN,MPA;Nnaemeka Samson Tressa RN,BSN;Kelly Dyane BS, ACSM CEP, Exercise Physiologist;Noah Tickle, BS, Exercise Physiologist    Virtual Visit No    Medication changes reported     No    Fall or balance concerns reported    No    Warm-up and Cool-down Performed on first and last piece of equipment    Resistance Training Performed Yes    VAD Patient? No    PAD/SET Patient? No      Pain Assessment   Currently in Pain? No/denies             Social History   Tobacco Use  Smoking Status Never  Smokeless Tobacco Never    Goals Met:  Independence with exercise equipment Exercise tolerated well No report of concerns or symptoms today Strength training completed today  Goals Unmet:  Not Applicable  Comments: Pt able to follow exercise prescription today without complaint.  Will continue to monitor for progression.    Dr. Oneil Pinal is Medical Director for [facility] Cardiac Rehabilitation.  Dr. Fuad Aleskerov is Medical Director for [facility] Pulmonary Rehabilitation.

## 2024-04-28 ENCOUNTER — Encounter: Admitting: *Deleted

## 2024-04-28 DIAGNOSIS — Z951 Presence of aortocoronary bypass graft: Secondary | ICD-10-CM

## 2024-04-28 DIAGNOSIS — Z48812 Encounter for surgical aftercare following surgery on the circulatory system: Secondary | ICD-10-CM | POA: Diagnosis not present

## 2024-04-28 NOTE — Progress Notes (Signed)
 Daily Session Note  Patient Details  Name: Linda Tucker MRN: [id number] Date of Birth: Aug 21, 1951 Referring Provider:   Flowsheet Row Cardiac Rehab from 03/02/2024 in [facility] Cardiac and Pulmonary Rehab  Referring Provider Dr. Toni Cool    Encounter Date: 04/28/2024  Check In:  Session Check In - 04/28/24 1359       Check-In   Supervising physician immediately available to respond to emergencies See telemetry face sheet for immediately available ER MD    Location [facility]-Cardiac & Pulmonary Rehab    Staff Present Hoy Rodney RN,BSN;Joseph [facility] BS, Exercise Physiologist;Noah Tickle, BS, Exercise Physiologist    Virtual Visit No    Medication changes reported     No    Fall or balance concerns reported    No    Warm-up and Cool-down Performed on first and last piece of equipment    Resistance Training Performed Yes    VAD Patient? No    PAD/SET Patient? No      Pain Assessment   Currently in Pain? No/denies             Social History   Tobacco Use  Smoking Status Never  Smokeless Tobacco Never    Goals Met:  Independence with exercise equipment Exercise tolerated well No report of concerns or symptoms today Strength training completed today  Goals Unmet:  Not Applicable  Comments: Pt able to follow exercise prescription today without complaint.  Will continue to monitor for progression.    Dr. Oneil Pinal is Medical Director for [facility] Cardiac Rehabilitation.  Dr. Fuad Aleskerov is Medical Director for [facility] Pulmonary Rehabilitation.

## 2024-05-02 ENCOUNTER — Encounter

## 2024-05-04 ENCOUNTER — Encounter

## 2024-05-04 DIAGNOSIS — Z951 Presence of aortocoronary bypass graft: Secondary | ICD-10-CM

## 2024-05-04 DIAGNOSIS — Z48812 Encounter for surgical aftercare following surgery on the circulatory system: Secondary | ICD-10-CM | POA: Diagnosis not present

## 2024-05-04 NOTE — Progress Notes (Signed)
 Daily Session Note  Patient Details  Name: JUNKO OHAGAN MRN: [id number] Date of Birth: 1951-03-23 Referring Provider:   Flowsheet Row Cardiac Rehab from 03/02/2024 in [facility] Cardiac and Pulmonary Rehab  Referring Provider Dr. Toni Cool    Encounter Date: 05/04/2024  Check In:  Session Check In - 05/04/24 1347       Check-In   Supervising physician immediately available to respond to emergencies See telemetry face sheet for immediately available ER MD    Location [facility]-Cardiac & Pulmonary Rehab    Staff Present Burnard Davenport [facility] Dyane BS, ACSM CEP, Exercise Physiologist;Noah Tickle, BS, Exercise Physiologist;Maxon Conetta BS, Exercise Physiologist    Virtual Visit No    Medication changes reported     No    Fall or balance concerns reported    No    Tobacco Cessation No Change    Warm-up and Cool-down Performed on first and last piece of equipment    Resistance Training Performed Yes    VAD Patient? No    PAD/SET Patient? No      Pain Assessment   Currently in Pain? No/denies             Social History   Tobacco Use  Smoking Status Never  Smokeless Tobacco Never    Goals Met:  Independence with exercise equipment Exercise tolerated well No report of concerns or symptoms today Strength training completed today  Goals Unmet:  Not Applicable  Comments: Pt able to follow exercise prescription today without complaint.  Will continue to monitor for progression.    Dr. Oneil Pinal is Medical Director for [facility] Cardiac Rehabilitation.  Dr. Fuad Aleskerov is Medical Director for [facility] Pulmonary Rehabilitation.

## 2024-05-05 ENCOUNTER — Encounter: Admitting: Emergency Medicine

## 2024-05-05 DIAGNOSIS — Z951 Presence of aortocoronary bypass graft: Secondary | ICD-10-CM

## 2024-05-05 DIAGNOSIS — Z48812 Encounter for surgical aftercare following surgery on the circulatory system: Secondary | ICD-10-CM | POA: Diagnosis not present

## 2024-05-05 NOTE — Progress Notes (Signed)
 Daily Session Note  Patient Details  Name: Linda Tucker MRN: [id number] Date of Birth: 1951/05/15 Referring Provider:   Flowsheet Row Cardiac Rehab from 03/02/2024 in [facility] Cardiac and Pulmonary Rehab  Referring Provider Dr. Toni Cool    Encounter Date: 05/05/2024  Check In:  Session Check In - 05/05/24 1346       Check-In   Supervising physician immediately available to respond to emergencies See telemetry face sheet for immediately available ER MD    Location [facility]-Cardiac & Pulmonary Rehab    Staff Present Fairy Plater RCP,RRT,BSRT;Maxon Conetta BS, Exercise Physiologist;Noah Tickle, BS, Exercise Physiologist    Virtual Visit No    Medication changes reported     No    Fall or balance concerns reported    No    Tobacco Cessation No Change    Warm-up and Cool-down Performed on first and last piece of equipment    Resistance Training Performed No    VAD Patient? No    PAD/SET Patient? No      Pain Assessment   Currently in Pain? No/denies             Social History   Tobacco Use  Smoking Status Never  Smokeless Tobacco Never    Goals Met:  Independence with exercise equipment Exercise tolerated well No report of concerns or symptoms today Strength training completed today  Goals Unmet:  Not Applicable  Comments: Pt able to follow exercise prescription today without complaint.  Will continue to monitor for progression.    Dr. Oneil Pinal is Medical Director for [facility] Cardiac Rehabilitation.  Dr. Fuad Aleskerov is Medical Director for [facility] Pulmonary Rehabilitation.

## 2024-05-09 ENCOUNTER — Encounter: Admitting: Emergency Medicine

## 2024-05-09 DIAGNOSIS — Z48812 Encounter for surgical aftercare following surgery on the circulatory system: Secondary | ICD-10-CM | POA: Diagnosis not present

## 2024-05-09 DIAGNOSIS — Z951 Presence of aortocoronary bypass graft: Secondary | ICD-10-CM

## 2024-05-09 NOTE — Progress Notes (Signed)
 Daily Session Note  Patient Details  Name: Linda Tucker MRN: [id number] Date of Birth: 1951/06/09 Referring Provider:   Flowsheet Row Cardiac Rehab from 03/02/2024 in [facility] Cardiac and Pulmonary Rehab  Referring Provider Dr. Toni Cool    Encounter Date: 05/09/2024  Check In:  Session Check In - 05/09/24 1359       Check-In   Supervising physician immediately available to respond to emergencies See telemetry face sheet for immediately available ER MD    Location [facility]-Cardiac & Pulmonary Rehab    Staff Present Devaughn Jaeger, BS, Exercise Physiologist;Susanne Bice, RN, BSN, CCRP;Maxon Conetta BS, Exercise Physiologist;Lauren Taneeka Curtner RN,BSN    Virtual Visit No    Medication changes reported     No    Fall or balance concerns reported    No    Tobacco Cessation No Change    Warm-up and Cool-down Performed on first and last piece of equipment    Resistance Training Performed Yes    VAD Patient? No    PAD/SET Patient? No      Pain Assessment   Currently in Pain? No/denies             Social History   Tobacco Use  Smoking Status Never  Smokeless Tobacco Never    Goals Met:  Independence with exercise equipment Exercise tolerated well No report of concerns or symptoms today Strength training completed today  Goals Unmet:  Not Applicable  Comments: Pt able to follow exercise prescription today without complaint.  Will continue to monitor for progression.    Dr. Oneil Pinal is Medical Director for [facility] Cardiac Rehabilitation.  Dr. Fuad Aleskerov is Medical Director for [facility] Pulmonary Rehabilitation.

## 2024-05-11 ENCOUNTER — Encounter

## 2024-05-11 DIAGNOSIS — Z48812 Encounter for surgical aftercare following surgery on the circulatory system: Secondary | ICD-10-CM | POA: Diagnosis not present

## 2024-05-11 DIAGNOSIS — Z951 Presence of aortocoronary bypass graft: Secondary | ICD-10-CM

## 2024-05-11 NOTE — Progress Notes (Signed)
 Daily Session Note  Patient Details  Name: Linda Tucker MRN: [id number] Date of Birth: 01/07/1951 Referring Provider:   Flowsheet Row Cardiac Rehab from 03/02/2024 in [facility] Cardiac and Pulmonary Rehab  Referring Provider Dr. Toni Cool    Encounter Date: 05/11/2024  Check In:  Session Check In - 05/11/24 1401       Check-In   Supervising physician immediately available to respond to emergencies See telemetry face sheet for immediately available ER MD    Location [facility]-Cardiac & Pulmonary Rehab    Staff Present Burnard Davenport RN,BSN,MPA;Meredith Tressa RN,BSN;Ryman Rathgeber Dyane BS, ACSM CEP, Exercise Physiologist;Laura Cates RN,BSN;Jason Elnor RDN,LDN    Virtual Visit No    Medication changes reported     Yes    Comments no longer taking Plavix    Fall or balance concerns reported    No    Tobacco Cessation No Change    Warm-up and Cool-down Performed on first and last piece of equipment    Resistance Training Performed Yes    VAD Patient? No    PAD/SET Patient? No      Pain Assessment   Currently in Pain? No/denies             Social History   Tobacco Use  Smoking Status Never  Smokeless Tobacco Never    Goals Met:  Independence with exercise equipment Exercise tolerated well No report of concerns or symptoms today Strength training completed today  Goals Unmet:  Not Applicable  Comments: Pt able to follow exercise prescription today without complaint.  Will continue to monitor for progression.    Dr. Oneil Pinal is Medical Director for [facility] Cardiac Rehabilitation.  Dr. Fuad Aleskerov is Medical Director for [facility] Pulmonary Rehabilitation.

## 2024-05-12 ENCOUNTER — Encounter: Admitting: *Deleted

## 2024-05-12 DIAGNOSIS — Z951 Presence of aortocoronary bypass graft: Secondary | ICD-10-CM

## 2024-05-12 DIAGNOSIS — Z48812 Encounter for surgical aftercare following surgery on the circulatory system: Secondary | ICD-10-CM | POA: Diagnosis not present

## 2024-05-12 NOTE — Progress Notes (Signed)
 Daily Session Note  Patient Details  Name: Linda Tucker MRN: [id number] Date of Birth: 1951-03-04 Referring Provider:   Flowsheet Row Cardiac Rehab from 03/02/2024 in [facility] Cardiac and Pulmonary Rehab  Referring Provider Dr. Toni Cool    Encounter Date: 05/12/2024  Check In:  Session Check In - 05/12/24 1336       Check-In   Supervising physician immediately available to respond to emergencies See telemetry face sheet for immediately available ER MD    Location [facility]-Cardiac & Pulmonary Rehab    Staff Present Devaughn Jaeger, BS, Exercise Physiologist;Joseph Rolinda RCP,RRT,BSRT;Janith Nielson Tressa RN,BSN;Laura Cates RN,BSN    Virtual Visit No    Medication changes reported     Yes    Comments started 81mg  Aspirin    Fall or balance concerns reported    No    Warm-up and Cool-down Performed on first and last piece of equipment    Resistance Training Performed Yes    VAD Patient? No    PAD/SET Patient? No      Pain Assessment   Currently in Pain? No/denies             Social History   Tobacco Use  Smoking Status Never  Smokeless Tobacco Never    Goals Met:  Independence with exercise equipment Exercise tolerated well No report of concerns or symptoms today Strength training completed today  Goals Unmet:  Not Applicable  Comments: Pt able to follow exercise prescription today without complaint.  Will continue to monitor for progression.    Dr. Oneil Pinal is Medical Director for [facility] Cardiac Rehabilitation.  Dr. Fuad Aleskerov is Medical Director for [facility] Pulmonary Rehabilitation.

## 2024-05-16 ENCOUNTER — Encounter: Admitting: Emergency Medicine

## 2024-05-16 VITALS — Ht 63.4 in

## 2024-05-16 DIAGNOSIS — Z48812 Encounter for surgical aftercare following surgery on the circulatory system: Secondary | ICD-10-CM | POA: Diagnosis not present

## 2024-05-16 DIAGNOSIS — Z951 Presence of aortocoronary bypass graft: Secondary | ICD-10-CM

## 2024-05-16 NOTE — Progress Notes (Signed)
 Daily Session Note  Patient Details  Name: NEIDY GUERRIERI MRN: [id number] Date of Birth: 06-20-1951 Referring Provider:   Flowsheet Row Cardiac Rehab from 03/02/2024 in [facility] Cardiac and Pulmonary Rehab  Referring Provider Dr. Toni Cool    Encounter Date: 05/16/2024  Check In:  Session Check In - 05/16/24 1359       Check-In   Location [facility]-Cardiac & Pulmonary Rehab    Staff Present Rollene Paterson, MS, Exercise Physiologist;Noah Tickle, BS, Exercise Physiologist;Maxon Conetta BS, Exercise Physiologist;Jordynn Perrier RN,BSN    Virtual Visit No    Medication changes reported     No    Fall or balance concerns reported    No    Tobacco Cessation No Change    Warm-up and Cool-down Performed on first and last piece of equipment    Resistance Training Performed Yes    VAD Patient? No    PAD/SET Patient? No      Pain Assessment   Currently in Pain? No/denies             Social History   Tobacco Use  Smoking Status Never  Smokeless Tobacco Never    Goals Met:  Independence with exercise equipment Exercise tolerated well No report of concerns or symptoms today Strength training completed today  Goals Unmet:  Not Applicable  Comments: Pt able to follow exercise prescription today without complaint.  Will continue to monitor for progression.    Dr. Oneil Pinal is Medical Director for [facility] Cardiac Rehabilitation.  Dr. Fuad Aleskerov is Medical Director for [facility] Pulmonary Rehabilitation.

## 2024-05-16 NOTE — Patient Instructions (Signed)
 Discharge Patient Instructions  Patient Details  Name: Linda Tucker MRN: [id number] Date of Birth: February 22, 1951 Referring Provider:  Juli Fess, MD   Number of Visits: 72  Reason for Discharge:  Patient reached a stable level of exercise. Patient independent in their exercise. Patient has met program and personal goals.  Diagnosis:  S/P CABG x 4  Initial Exercise Prescription:  Initial Exercise Prescription - 03/02/24 1300       Date of Initial Exercise RX and Referring Provider   Date 03/02/24    Referring Provider Dr. Toni Cool      Oxygen   Maintain Oxygen Saturation 88% or higher      Recumbant Bike   Level 1    RPM 50    Watts 25    Minutes 15    METs 2      NuStep   Level 1    SPM 80    Minutes 15    METs 2      Biostep-RELP   Level 1    SPM 50    Minutes 15    METs 2      Track   Laps 16    Minutes 15    METs 2      Intensity   THRR 40-80% of Max Heartrate 107-133    Ratings of Perceived Exertion 11-13    Perceived Dyspnea 0-4      Progression   Progression Continue to progress workloads to maintain intensity without signs/symptoms of physical distress.      Resistance Training   Training Prescription Yes    Weight 2lb    Reps 10-15          Discharge Exercise Prescription (Final Exercise Prescription Changes):  Exercise Prescription Changes - 05/04/24 0900       Response to Exercise   Blood Pressure (Admit) 138/60    Blood Pressure (Exit) 128/68    Heart Rate (Admit) 86 bpm    Heart Rate (Exercise) 100 bpm    Heart Rate (Exit) 80 bpm    Rating of Perceived Exertion (Exercise) 15    Symptoms none    Duration Progress to 30 minutes of  aerobic without signs/symptoms of physical distress    Intensity THRR unchanged      Progression   Progression Continue to progress workloads to maintain intensity without signs/symptoms of physical distress.    Average METs 1.8      Resistance Training   Training Prescription Yes     Weight 2lb    Reps 10-15      Interval Training   Interval Training No      Track   Laps 20    Minutes 30    METs 2.09      Home Exercise Plan   Plans to continue exercise at Home (comment)   use elliptical type machine at home, look into pool membership   Frequency Add 2 additional days to program exercise sessions.    Initial Home Exercises Provided 04/20/24      Oxygen   Maintain Oxygen Saturation 88% or higher          Functional Capacity:  6 Minute Walk     Row Name 03/02/24 1141 05/16/24 1432       6 Minute Walk   Phase Initial Discharge    Distance 455 feet 460 feet    Distance % Change -- 1.1 %    Distance Feet Change -- 5 ft  Walk Time 4.22 minutes 3.53 minutes    # of Rest Breaks 3 3    MPH 1.18 1.48    METS 1 0.82    RPE 13 15    Perceived Dyspnea  1 2    VO2 Peak 2.7 2.88    Symptoms Yes (comment) Yes (comment)    Comments Hip pain bilateral, lower back discomfort Dizziness    Resting HR 82 bpm 80 bpm    Resting BP 122/60 138/66    Resting Oxygen Saturation  97 % 98 %    Exercise Oxygen Saturation  during 6 min walk 99 % 93 %    Max Ex. HR 96 bpm 90 bpm    Max Ex. BP 144/62 160/66    2 Minute Post BP 130/62 120/60      Nutrition & Weight - Outcomes:  Pre Biometrics - 03/02/24 1310       Pre Biometrics   Height 5' 3.4 (1.61 m)    Weight 202 lb 12.8 oz (92 kg)    Waist Circumference 46.5 inches    Hip Circumference 41 inches    Waist to Hip Ratio 1.13 %    BMI (Calculated) 35.49    Single Leg Stand 0 seconds          Post Biometrics - 05/16/24 1436        Post  Biometrics   Height 5' 3.4 (1.61 m)    Waist Circumference 48.4 inches    Hip Circumference 50.7 inches    Waist to Hip Ratio 0.95 %    Single Leg Stand 0.5 seconds

## 2024-05-18 ENCOUNTER — Encounter

## 2024-05-18 DIAGNOSIS — Z951 Presence of aortocoronary bypass graft: Secondary | ICD-10-CM

## 2024-05-18 NOTE — Progress Notes (Signed)
 Cardiac Individual Treatment Plan  Patient Details  Name: LILYANNA LUNT MRN: [id number] Date of Birth: [birth year] Referring Provider:   Flowsheet Row Cardiac Rehab from 03/02/2024 in [facility] Cardiac and Pulmonary Rehab  Referring Provider Dr. Toni Cool    Initial Encounter Date:  Flowsheet Row Cardiac Rehab from 03/02/2024 in [facility] Cardiac and Pulmonary Rehab  Date 03/02/24    Visit Diagnosis: S/P CABG x 4  Patient's Home Medications on Admission:  Current Outpatient Medications:    apixaban (ELIQUIS) 5 MG TABS tablet, Take 5 mg by mouth 2 (two) times daily., Disp: , Rfl:    atorvastatin (LIPITOR) 40 MG tablet, Take 40 mg by mouth daily., Disp: , Rfl:    benzonatate  (TESSALON ) 200 MG capsule, Take 1 capsule (200 mg total) by mouth 3 (three) times daily as needed for cough. (Patient not taking: Reported on 03/01/2024), Disp: 30 capsule, Rfl: 0   Chromium 200 MCG CAPS, Take 200 mcg by mouth daily., Disp: , Rfl:    clopidogrel (PLAVIX) 75 MG tablet, Take 75 mg by mouth daily., Disp: , Rfl:    co-enzyme Q-10 30 MG capsule, Take by mouth. (Patient not taking: Reported on 03/01/2024), Disp: , Rfl:    cyanocobalamin (VITAMIN B12) 1000 MCG tablet, Take 1,000 mcg by mouth daily., Disp: , Rfl:    diltiazem (CARDIZEM CD) 180 MG 24 hr capsule, Take 120 mg by mouth daily., Disp: , Rfl:    fexofenadine (ALLEGRA ODT) 30 MG disintegrating tablet, Take 30 mg by mouth daily., Disp: , Rfl:    fluticasone  (FLONASE ) 50 MCG/ACT nasal spray, Place 2 sprays into both nostrils daily. (Patient not taking: Reported on 03/01/2024), Disp: 16 g, Rfl: 0   gabapentin (NEURONTIN) 300 MG capsule, Take 900 mg by mouth 2 (two) times daily., Disp: , Rfl:    hydrochlorothiazide (HYDRODIURIL) 25 MG tablet, Take 25 mg by mouth daily. (Patient not taking: Reported on 03/01/2024), Disp: , Rfl:    insulin glargine (LANTUS) 100 UNIT/ML injection, Inject 60 Units into the skin daily., Disp: , Rfl:    insulin regular (NOVOLIN R) 100  units/mL injection, Inject into the skin 3 (three) times daily before meals., Disp: , Rfl:    lisinopril (ZESTRIL) 20 MG tablet, Take 10 mg by mouth daily., Disp: , Rfl:    Magnesium Glycinate 100 MG CAPS, Take 300 mg by mouth daily., Disp: , Rfl:    Melatonin 5 MG CAPS, Take by mouth., Disp: , Rfl:    metFORMIN (GLUCOPHAGE) 850 MG tablet, Take 850 mg by mouth 2 (two) times daily with a meal., Disp: , Rfl:    metoprolol succinate (TOPROL-XL) 25 MG 24 hr tablet, Take 25 mg by mouth daily., Disp: , Rfl:    Multiple Vitamin (MULTI-VITAMIN) tablet, Take 1 tablet by mouth daily., Disp: , Rfl:    simvastatin (ZOCOR) 40 MG tablet, Take 40 mg by mouth daily. (Patient not taking: Reported on 03/01/2024), Disp: , Rfl:    Vitamin E 45 MG (100 UNIT) CAPS, Take by mouth. (Patient not taking: Reported on 03/01/2024), Disp: , Rfl:   Past Medical History: Past Medical History:  Diagnosis Date   Chronic back pain    Chronic hip pain    High cholesterol    Hypertension    Neuropathy    Type 2 diabetes mellitus (HCC)     Tobacco Use: Social History   Tobacco Use  Smoking Status Never  Smokeless Tobacco Never    Labs: Review Flowsheet  No data to display           Exercise Target Goals: Exercise Program Goal: Individual exercise prescription set using results from initial 6 min walk test and THRR while considering  patient's activity barriers and safety.   Exercise Prescription Goal: Initial exercise prescription builds to 30-45 minutes a day of aerobic activity, 2-3 days per week.  Home exercise guidelines will be given to patient during program as part of exercise prescription that the participant will acknowledge.   Education: Aerobic Exercise: - Group verbal and visual presentation on the components of exercise prescription. Introduces F.I.T.T principle from ACSM for exercise prescriptions.  Reviews F.I.T.T. principles of aerobic exercise including progression. Written material  provided at class time.   Education: Resistance Exercise: - Group verbal and visual presentation on the components of exercise prescription. Introduces F.I.T.T principle from ACSM for exercise prescriptions  Reviews F.I.T.T. principles of resistance exercise including progression. Written material provided at class time. Flowsheet Row Cardiac Rehab from 05/11/2024 in [facility] Cardiac and Pulmonary Rehab  Date 04/20/24  Educator [facility]  Instruction Review Code 1- [employer] Understanding     Education: Exercise & Equipment Safety: - Individual verbal instruction and demonstration of equipment use and safety with use of the equipment. Flowsheet Row Cardiac Rehab from 05/11/2024 in [facility] Cardiac and Pulmonary Rehab  Date 03/02/24  Educator [facility]  Instruction Review Code 1- Verbalizes Understanding    Education: Exercise Physiology & General Exercise Guidelines: - Group verbal and written instruction with models to review the exercise physiology of the cardiovascular system and associated critical values. Provides general exercise guidelines with specific guidelines to those with heart or lung disease. Written material provided at class time. Flowsheet Row Cardiac Rehab from 05/11/2024 in [facility] Cardiac and Pulmonary Rehab  Date 04/13/24  Educator [facility]  Instruction Review Code 1- [employer] Understanding    Education: Flexibility, Balance, Mind/Body Relaxation: - Group verbal and visual presentation with interactive activity on the components of exercise prescription. Introduces F.I.T.T principle from ACSM for exercise prescriptions. Reviews F.I.T.T. principles of flexibility and balance exercise training including progression. Also discusses the mind body connection.  Reviews various relaxation techniques to help reduce and manage stress (i.e. Deep breathing, progressive muscle relaxation, and visualization). Balance handout provided to take home. Written material provided at class time. Flowsheet Row Cardiac  Rehab from 05/11/2024 in [facility] Cardiac and Pulmonary Rehab  Date 04/20/24  Educator [facility]  Instruction Review Code 1- Verbalizes Understanding    Activity Barriers & Risk Stratification:  Activity Barriers & Cardiac Risk Stratification - 03/01/24 1453       Activity Barriers & Cardiac Risk Stratification   Activity Barriers History of Falls;Assistive Device    Cardiac Risk Stratification High          6 Minute Walk:  6 Minute Walk     Row Name 03/02/24 1141 05/16/24 1432       6 Minute Walk   Phase Initial Discharge    Distance 455 feet 460 feet    Distance % Change -- 1.1 %    Distance Feet Change -- 5 ft    Walk Time 4.22 minutes 3.53 minutes    # of Rest Breaks 3 3    MPH 1.18 1.48    METS 1 0.82    RPE 13 15    Perceived Dyspnea  1 2    VO2 Peak 2.7 2.88    Symptoms Yes (comment) Yes (comment)    Comments Hip pain bilateral, lower back  discomfort Dizziness    Resting HR 82 bpm 80 bpm    Resting BP 122/60 138/66    Resting Oxygen Saturation  97 % 98 %    Exercise Oxygen Saturation  during 6 min walk 99 % 93 %    Max Ex. HR 96 bpm 90 bpm    Max Ex. BP 144/62 160/66    2 Minute Post BP 130/62 120/60       Oxygen Initial Assessment:   Oxygen Re-Evaluation:   Oxygen Discharge (Final Oxygen Re-Evaluation):   Initial Exercise Prescription:  Initial Exercise Prescription - 03/02/24 1300       Date of Initial Exercise RX and Referring Provider   Date 03/02/24    Referring Provider Dr. Toni Cool      Oxygen   Maintain Oxygen Saturation 88% or higher      Recumbant Bike   Level 1    RPM 50    Watts 25    Minutes 15    METs 2      NuStep   Level 1    SPM 80    Minutes 15    METs 2      Biostep-RELP   Level 1    SPM 50    Minutes 15    METs 2      Track   Laps 16    Minutes 15    METs 2      Intensity   THRR 40-80% of Max Heartrate 107-133    Ratings of Perceived Exertion 11-13    Perceived Dyspnea 0-4      Progression    Progression Continue to progress workloads to maintain intensity without signs/symptoms of physical distress.      Resistance Training   Training Prescription Yes    Weight 2lb    Reps 10-15          Perform Capillary Blood Glucose checks as needed.  Exercise Prescription Changes:   Exercise Prescription Changes     Row Name 03/02/24 1300 03/23/24 1300 04/07/24 1100 04/19/24 1500 04/20/24 1400     Response to Exercise   Blood Pressure (Admit) 122/60 144/68 122/68 138/76 --   Blood Pressure (Exercise) 144/62 160/78 156/86 144/70 --   Blood Pressure (Exit) 130/62 126/62 132/72 150/60 --   Heart Rate (Admit) 82 bpm 87 bpm 88 bpm 87 bpm --   Heart Rate (Exercise) 96 bpm 102 bpm 101 bpm 108 bpm --   Heart Rate (Exit) 86 bpm 85 bpm 87 bpm 86 bpm --   Oxygen Saturation (Admit) 97 % -- -- -- --   Oxygen Saturation (Exercise) 99 % -- -- -- --   Oxygen Saturation (Exit) 99 % -- -- -- --   Rating of Perceived Exertion (Exercise) 13 15 15 15  --   Perceived Dyspnea (Exercise) 1 0 -- -- --   Symptoms Bilateral hip pain, back discomfort shin pain none none --   Comments results first 2 weeks of exercise -- -- --   Duration -- Progress to 30 minutes of  aerobic without signs/symptoms of physical distress Progress to 30 minutes of  aerobic without signs/symptoms of physical distress Progress to 30 minutes of  aerobic without signs/symptoms of physical distress Progress to 30 minutes of  aerobic without signs/symptoms of physical distress   Intensity -- THRR unchanged THRR unchanged THRR unchanged THRR unchanged     Progression   Progression -- Continue to progress workloads to maintain intensity without signs/symptoms of  physical distress. Continue to progress workloads to maintain intensity without signs/symptoms of physical distress. Continue to progress workloads to maintain intensity without signs/symptoms of physical distress. Continue to progress workloads to maintain intensity without  signs/symptoms of physical distress.   Average METs -- 1.64 1.53 1.76 1.76     Resistance Training   Training Prescription -- Yes Yes Yes Yes   Weight -- 2lb 2lb 2lb 2lb   Reps -- 10-15 10-15 10-15 10-15     Interval Training   Interval Training -- No No No No     NuStep   Level -- 1 1 -- --   Minutes -- 15 15 -- --   METs -- 2.1 1.7 -- --     T5 Nustep   Level -- 1 -- -- --   Minutes -- 15 -- -- --   METs -- 1.8 -- -- --     Biostep-RELP   Level -- 1 -- -- --   Minutes -- 15 -- -- --   METs -- 2 -- -- --     Track   Laps -- 10 10 18 18    Minutes -- 15 15 30 30    METs -- 1.54 1.54 1.98 1.98     Home Exercise Plan   Plans to continue exercise at -- -- -- -- Home (comment)  use elliptical type machine at home, look into pool membership   Frequency -- -- -- -- Add 2 additional days to program exercise sessions.   Initial Home Exercises Provided -- -- -- -- 04/20/24     Oxygen   Maintain Oxygen Saturation -- 88% or higher 88% or higher 88% or higher 88% or higher    Row Name 05/04/24 0900             Response to Exercise   Blood Pressure (Admit) 138/60       Blood Pressure (Exit) 128/68       Heart Rate (Admit) 86 bpm       Heart Rate (Exercise) 100 bpm       Heart Rate (Exit) 80 bpm       Rating of Perceived Exertion (Exercise) 15       Symptoms none       Duration Progress to 30 minutes of  aerobic without signs/symptoms of physical distress       Intensity THRR unchanged         Progression   Progression Continue to progress workloads to maintain intensity without signs/symptoms of physical distress.       Average METs 1.8         Resistance Training   Training Prescription Yes       Weight 2lb       Reps 10-15         Interval Training   Interval Training No         Track   Laps 20       Minutes 30       METs 2.09         Home Exercise Plan   Plans to continue exercise at Home (comment)  use elliptical type machine at home, look into pool  membership       Frequency Add 2 additional days to program exercise sessions.       Initial Home Exercises Provided 04/20/24         Oxygen   Maintain Oxygen Saturation 88% or higher  Exercise Comments:   Exercise Comments     Row Name 03/07/24 1418           Exercise Comments First full day of exercise!  Patient was oriented to gym and equipment including functions, settings, policies, and procedures.  Patient's individual exercise prescription and treatment plan were reviewed.  All starting workloads were established based on the results of the 6 minute walk test done at initial orientation visit.  The plan for exercise progression was also introduced and progression will be customized based on patient's performance and goals.          Exercise Goals and Review:   Exercise Goals     Row Name 03/02/24 1309             Exercise Goals   Increase Physical Activity Yes       Intervention Provide advice, education, support and counseling about physical activity/exercise needs.;Develop an individualized exercise prescription for aerobic and resistive training based on initial evaluation findings, risk stratification, comorbidities and participant's personal goals.       Expected Outcomes Short Term: Attend rehab on a regular basis to increase amount of physical activity.;Long Term: Add in home exercise to make exercise part of routine and to increase amount of physical activity.;Long Term: Exercising regularly at least 3-5 days a week.       Increase Strength and Stamina Yes       Intervention Provide advice, education, support and counseling about physical activity/exercise needs.;Develop an individualized exercise prescription for aerobic and resistive training based on initial evaluation findings, risk stratification, comorbidities and participant's personal goals.       Expected Outcomes Long Term: Improve cardiorespiratory fitness, muscular endurance and strength as  measured by increased METs and functional capacity ( );Short Term: Perform resistance training exercises routinely during rehab and add in resistance training at home;Short Term: Increase workloads from initial exercise prescription for resistance, speed, and METs.       Able to understand and use rate of perceived exertion (RPE) scale Yes       Intervention Provide education and explanation on how to use RPE scale       Expected Outcomes Short Term: Able to use RPE daily in rehab to express subjective intensity level;Long Term:  Able to use RPE to guide intensity level when exercising independently       Able to understand and use Dyspnea scale Yes       Intervention Provide education and explanation on how to use Dyspnea scale       Expected Outcomes Short Term: Able to use Dyspnea scale daily in rehab to express subjective sense of shortness of breath during exertion;Long Term: Able to use Dyspnea scale to guide intensity level when exercising independently       Knowledge and understanding of Target Heart Rate Range (THRR) Yes       Intervention Provide education and explanation of THRR including how the numbers were predicted and where they are located for reference       Expected Outcomes Short Term: Able to state/look up THRR;Short Term: Able to use daily as guideline for intensity in rehab;Long Term: Able to use THRR to govern intensity when exercising independently       Able to check pulse independently Yes       Intervention Provide education and demonstration on how to check pulse in carotid and radial arteries.;Review the importance of being able to check your own pulse for safety during independent exercise  Expected Outcomes Short Term: Able to explain why pulse checking is important during independent exercise;Long Term: Able to check pulse independently and accurately       Understanding of Exercise Prescription Yes       Intervention Provide education, explanation, and written  materials on patient's individual exercise prescription       Expected Outcomes Long Term: Able to explain home exercise prescription to exercise independently;Short Term: Able to explain program exercise prescription          Exercise Goals Re-Evaluation :  Exercise Goals Re-Evaluation     Row Name 03/07/24 1420 03/23/24 1355 04/07/24 1104 04/18/24 1354 04/19/24 1502     Exercise Goal Re-Evaluation   Exercise Goals Review Increase Physical Activity;Able to understand and use rate of perceived exertion (RPE) scale;Knowledge and understanding of Target Heart Rate Range (THRR);Understanding of Exercise Prescription;Increase Strength and Stamina;Able to check pulse independently Increase Physical Activity;Increase Strength and Stamina;Understanding of Exercise Prescription Increase Physical Activity;Increase Strength and Stamina;Understanding of Exercise Prescription Increase Physical Activity;Increase Strength and Stamina;Understanding of Exercise Prescription Increase Physical Activity;Increase Strength and Stamina;Understanding of Exercise Prescription   Comments Reviewed RPE and dyspnea scale, THR and program prescription with pt today.  Pt voiced understanding and was given a copy of goals to take home. Ahnesty is off to a great start in the program. She has been able to use both the T4 and T5 nustep at level 1. She was also able to walk 10 laps on the track. We will continue to monitor her progress in the program. Gavyn is doing well in rehab. She maintained level 1 on the T4 nustep. She also maintained walking 10 laps on the track. We will continue to monitor her progress in the program. Mada is doing well here at rehab, she is not doing much at home but plans to do some water aerobics once she graduates from rehab. Seleste is doing well in rehab. She increased her laps on the track to 18 laps in 30 minutes with breaks. We will continue to monitor her progress in the program.   Expected Outcomes Short:  Use RPE daily to regulate intensity.  Long: Follow program prescription in THR. Short: Continue to follow exercise prescription. Long: Continue exercise to improve strength and stamina. Short: Try level 2 on the T4 nustep. Long: Continue exercise to improve strength and stamina. STG: increase workload here at rehab. LTG: Continue to exercise to strength and stamina Short: Continue to progressively increase laps on the track. Long: Continue exercise to improve strength and stamina.    Row Name 04/20/24 1429 05/04/24 0959           Exercise Goal Re-Evaluation   Exercise Goals Review Increase Physical Activity;Able to understand and use rate of perceived exertion (RPE) scale;Knowledge and understanding of Target Heart Rate Range (THRR);Understanding of Exercise Prescription;Increase Strength and Stamina;Able to understand and use Dyspnea scale;Able to check pulse independently Increase Physical Activity;Increase Strength and Stamina;Understanding of Exercise Prescription      Comments Reviewed home exercise with pt today.  Pt plans to use an elliptical machine at home and look into joining the sports plex and using the pool for exercise.  Reviewed THR, pulse, RPE, sign and symptoms, pulse oximetery and when to call 911 or MD.  Also discussed weather considerations and indoor options.  Pt voiced understanding. Xan is doing well in rehab. She has been able to increase from 18 to 20 laps on the track in 30 minutes. We will conitnue to  monitor her progress in the program.      Expected Outcomes Short: add 1-2 days a week of exericse at home on off days of rehab. Look into joining the sports plex near her house to start swimming. Long: maintian independent exercise routine upon graduation from cardiac rehab. Short: Continue to increase track laps. Long: Continue exercise to improve strength and stamina.         Discharge Exercise Prescription (Final Exercise Prescription Changes):  Exercise Prescription  Changes - 05/04/24 0900       Response to Exercise   Blood Pressure (Admit) 138/60    Blood Pressure (Exit) 128/68    Heart Rate (Admit) 86 bpm    Heart Rate (Exercise) 100 bpm    Heart Rate (Exit) 80 bpm    Rating of Perceived Exertion (Exercise) 15    Symptoms none    Duration Progress to 30 minutes of  aerobic without signs/symptoms of physical distress    Intensity THRR unchanged      Progression   Progression Continue to progress workloads to maintain intensity without signs/symptoms of physical distress.    Average METs 1.8      Resistance Training   Training Prescription Yes    Weight 2lb    Reps 10-15      Interval Training   Interval Training No      Track   Laps 20    Minutes 30    METs 2.09      Home Exercise Plan   Plans to continue exercise at Home (comment)   use elliptical type machine at home, look into pool membership   Frequency Add 2 additional days to program exercise sessions.    Initial Home Exercises Provided 04/20/24      Oxygen   Maintain Oxygen Saturation 88% or higher          Nutrition:  Target Goals: Understanding of nutrition guidelines, daily intake of sodium 1500mg , cholesterol 200mg , calories 30% from fat and 7% or less from saturated fats, daily to have 5 or more servings of fruits and vegetables.  Education: Nutrition 1 -Group instruction provided by verbal, written material, interactive activities, discussions, models, and posters to present general guidelines for heart healthy nutrition including macronutrients, label reading, and promoting whole foods over processed counterparts. Education serves as Pensions consultant of discussion of heart healthy eating for all. Written material provided at class time. Flowsheet Row Cardiac Rehab from 05/11/2024 in [facility] Cardiac and Pulmonary Rehab  Date 05/04/24  Educator jg  Instruction Review Code 1- Verbalizes Understanding     Education: Nutrition 2 -Group instruction provided by verbal,  written material, interactive activities, discussions, models, and posters to present general guidelines for heart healthy nutrition including sodium, cholesterol, and saturated fat. Providing guidance of habit forming to improve blood pressure, cholesterol, and body weight. Written material provided at class time. Flowsheet Row Cardiac Rehab from 05/11/2024 in [facility] Cardiac and Pulmonary Rehab  Date 05/11/24  Educator jg  Instruction Review Code 1- Verbalizes Understanding      Biometrics:  Pre Biometrics - 03/02/24 1310       Pre Biometrics   Height 5' 3.4 (1.61 m)    Weight 202 lb 12.8 oz (92 kg)    Waist Circumference 46.5 inches    Hip Circumference 41 inches    Waist to Hip Ratio 1.13 %    BMI (Calculated) 35.49    Single Leg Stand 0 seconds          Post  Biometrics - 05/16/24 1436        Post  Biometrics   Height 5' 3.4 (1.61 m)    Waist Circumference 48.4 inches    Hip Circumference 50.7 inches    Waist to Hip Ratio 0.95 %    Single Leg Stand 0.5 seconds          Nutrition Therapy Plan and Nutrition Goals:   Nutrition Assessments:  MEDIFICTS Score Key: >=70 Need to make dietary changes  40-70 Heart Healthy Diet <= 40 Therapeutic Level Cholesterol Diet  Flowsheet Row Cardiac Rehab from 03/07/2024 in [facility] Cardiac and Pulmonary Rehab  Picture Your Plate Total Score on Admission 52   Picture Your Plate Scores: <59 Unhealthy dietary pattern with much room for improvement. 41-50 Dietary pattern unlikely to meet recommendations for good health and room for improvement. 51-60 More healthful dietary pattern, with some room for improvement.  >60 Healthy dietary pattern, although there may be some specific behaviors that could be improved.    Nutrition Goals Re-Evaluation:  Nutrition Goals Re-Evaluation     Row Name 04/18/24 1358 05/04/24 1349           Goals   Comment Joella reports she has been drinking more water lately. She is eating better since  starting rehab. Her husband is a retired [employer] fundraiser and makes sure she is eating well. Orvella continues to eat well, her husband helping her stay on track with heart health.      Expected Outcome STG: Drink 64oz of water and eat 3 times per day. LTG: Follow a heart health diet STG: Drink 64oz of water and eat 3 times per day. LTG: Follow a heart health diet         Nutrition Goals Discharge (Final Nutrition Goals Re-Evaluation):  Nutrition Goals Re-Evaluation - 05/04/24 1349       Goals   Comment Nivedita continues to eat well, her husband helping her stay on track with heart health.    Expected Outcome STG: Drink 64oz of water and eat 3 times per day. LTG: Follow a heart health diet          Psychosocial: Target Goals: Acknowledge presence or absence of significant depression and/or stress, maximize coping skills, provide positive support system. Participant is able to verbalize types and ability to use techniques and skills needed for reducing stress and depression.   Education: Stress, Anxiety, and Depression - Group verbal and visual presentation to define topics covered.  Reviews how body is impacted by stress, anxiety, and depression.  Also discusses healthy ways to reduce stress and to treat/manage anxiety and depression. Written material provided at class time.   Education: Sleep Hygiene -Provides group verbal and written instruction about how sleep can affect your health.  Define sleep hygiene, discuss sleep cycles and impact of sleep habits. Review good sleep hygiene tips.   Initial Review & Psychosocial Screening:  Initial Psych Review & Screening - 03/01/24 1432       Initial Review   Current issues with Current Sleep Concerns   states she has to get up every 2-3 hours to use the restroom     Family Dynamics   Good Support System? Yes   husband and children/their families     Barriers   Psychosocial barriers to participate in program There are no identifiable barriers or  psychosocial needs.;The patient should benefit from training in stress management and relaxation.      Screening Interventions   Interventions Encouraged to exercise  Quality of Life Scores:   Quality of Life - 03/08/24 1552       Quality of Life   Select Quality of Life      Quality of Life Scores   Health/Function Pre 26.1 %    Socioeconomic Pre 27.69 %    Psych/Spiritual Pre 28.57 %    Family Pre 29.5 %    GLOBAL Pre 27.44 %         Scores of 19 and below usually indicate a poorer quality of life in these areas.  A difference of  2-3 points is a clinically meaningful difference.  A difference of 2-3 points in the total score of the Quality of Life Index has been associated with significant improvement in overall quality of life, self-image, physical symptoms, and general health in studies assessing change in quality of life.  PHQ-9: Review Flowsheet       05/04/2024 04/18/2024 03/02/2024  Depression screen PHQ 2/9  Decreased Interest 1 1 1   Down, Depressed, Hopeless 0 0 0  PHQ - 2 Score 1 1 1   Altered sleeping 0 1 1  Tired, decreased energy 1 2 2   Change in appetite 1 1 2   Feeling bad or failure about yourself  0 0 0  Trouble concentrating 0 0 0  Moving slowly or fidgety/restless 0 0 0  Suicidal thoughts 0 0 0  PHQ-9 Score 3 5 6   Difficult doing work/chores Somewhat difficult Somewhat difficult Somewhat difficult   Interpretation of Total Score  Total Score Depression Severity:  1-4 = Minimal depression, 5-9 = Mild depression, 10-14 = Moderate depression, 15-19 = Moderately severe depression, 20-27 = Severe depression   Psychosocial Evaluation and Intervention:  Psychosocial Evaluation - 03/01/24 1443       Psychosocial Evaluation & Interventions   Comments 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 She stated that her blood pressure is well managed and her husband has been monitoring it twice daily since the doctor recently changed her BP medications.  Patient has diabetes type 2 and takes insulin injections. Patient stated she wears a monitor on her arm to assist with blood sugar control and her last A1C was 7% of which she was pleased. Patient stated she has been working to lose weight and has lost some weight slowly and safely. Patient stated her sleep is often interrupted as she has to go to the restroom every 2-3 hours. Patient's oldest daughter and husband were involved in the initial call and patient stated she has a good support system of her family and does not note any issues with stress or depression. 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 She also uses a cane or walker, and wheelchair at times, to mobilize outside of the home. She states she prefers visual learning aids but is able to read.    Expected Outcomes ST: attend cardiac rehab for education and exercise; LT: develop and maintain positive self-care habits          Psychosocial Re-Evaluation:  Psychosocial Re-Evaluation     Row Name 04/18/24 1355 05/04/24 1347           Psychosocial Re-Evaluation   Current issues with Current Sleep Concerns --      Comments Sharron reports she doesnt sleep well, wakes up in middle of night to use bathroom. Then she sometimes will struggle to go back to sleep. She met with a Dr today and got a sleeping pill. She  denies any anxiety, stress or depression at this time. Leshia denies nay anxiety or depression, has good support from family. She was sleeping poorly, but was given sleep medication 2 weeks ago. She reports she sleeps so much better now.      Expected Outcomes STG: try sleeping medication, focus on good sleep. LTG: Achieve and maintain a positive outlook on health and daily life STG: Focus on good sleep. LTG: Achieve and maintain a positive outlook on health and daily life      Interventions Encouraged to attend Cardiac Rehabilitation for the exercise Encouraged to attend Cardiac  Rehabilitation for the exercise      Continue Psychosocial Services  Follow up required by staff Follow up required by staff         Psychosocial Discharge (Final Psychosocial Re-Evaluation):  Psychosocial Re-Evaluation - 05/04/24 1347       Psychosocial Re-Evaluation   Comments Sherlyne denies nay anxiety or depression, has good support from family. She was sleeping poorly, but was given sleep medication 2 weeks ago. She reports she sleeps so much better now.    Expected Outcomes STG: Focus on good sleep. LTG: Achieve and maintain a positive outlook on health and daily life    Interventions Encouraged to attend Cardiac Rehabilitation for the exercise    Continue Psychosocial Services  Follow up required by staff          Vocational Rehabilitation: Provide vocational rehab assistance to qualifying candidates.   Vocational Rehab Evaluation & Intervention:  Vocational Rehab - 03/01/24 1434       Initial Vocational Rehab Evaluation & Intervention   Assessment shows need for Vocational Rehabilitation No          Education: Education Goals: Education classes will be provided on a variety of topics geared toward better understanding of heart health and risk factor modification. Participant will state understanding/return demonstration of topics presented as noted by education test scores.  Learning Barriers/Preferences:  Learning Barriers/Preferences - 03/01/24 1431       Learning Barriers/Preferences   Learning Barriers Hearing   wears hearing aids   Learning Preferences Individual Instruction;Verbal Instruction;Video          General Cardiac Education Topics:  AED/CPR: - Group verbal and written instruction with the use of models to demonstrate the basic use of the AED with the basic ABC's of resuscitation.   Test and Procedures: - Group verbal and visual presentation and models provide information about basic cardiac anatomy and function. Reviews the testing methods  done to diagnose heart disease and the outcomes of the test results. Describes the treatment choices: Medical Management, Angioplasty, or Coronary Bypass Surgery for treating various heart conditions including Myocardial Infarction, Angina, Valve Disease, and Cardiac Arrhythmias. Written material provided at class time.   Medication Safety: - Group verbal and visual instruction to review commonly prescribed medications for heart and lung disease. Reviews the medication, class of the drug, and side effects. Includes the steps to properly store meds and maintain the prescription regimen. Written material provided at class time. Flowsheet Row Cardiac Rehab from 05/11/2024 in [facility] Cardiac and Pulmonary Rehab  Date 03/16/24  Educator sb  Instruction Review Code 1- Verbalizes Understanding    Intimacy: - Group verbal instruction through game format to discuss how heart and lung disease can affect sexual intimacy. Written material provided at class time.   Know Your Numbers and Heart Failure: - Group verbal and visual instruction to discuss disease risk factors for cardiac and pulmonary disease  and treatment options.  Reviews associated critical values for Overweight/Obesity, Hypertension, Cholesterol, and Diabetes.  Discusses basics of heart failure: signs/symptoms and treatments.  Introduces Heart Failure Zone chart for action plan for heart failure. Written material provided at class time.   Infection Prevention: - Provides verbal and written material to individual with discussion of infection control including proper hand washing and proper equipment cleaning during exercise session. Flowsheet Row Cardiac Rehab from 05/11/2024 in [facility] Cardiac and Pulmonary Rehab  Date 03/02/24  Educator [facility]  Instruction Review Code 1- Verbalizes Understanding    Falls Prevention: - Provides verbal and written material to individual with discussion of falls prevention and safety. Flowsheet Row Cardiac Rehab  from 05/11/2024 in [facility] Cardiac and Pulmonary Rehab  Date 03/02/24  Educator [facility]  Instruction Review Code 1- Verbalizes Understanding    Other: -Provides group and verbal instruction on various topics (see comments)   Knowledge Questionnaire Score:   Core Components/Risk Factors/Patient Goals at Admission:  Personal Goals and Risk Factors at Admission - 03/01/24 1430       Core Components/Risk Factors/Patient Goals on Admission    Weight Management Yes    Intervention Weight Management/Obesity: Establish reasonable short term and long term weight goals.;Weight Management: Provide education and appropriate resources to help participant work on and attain dietary goals.;Obesity: Provide education and appropriate resources to help participant work on and attain dietary goals.    Expected Outcomes Short Term: Continue to assess and modify interventions until short term weight is achieved;Long Term: Adherence to nutrition and physical activity/exercise program aimed toward attainment of established weight goal;Weight Loss: Understanding of general recommendations for a balanced deficit meal plan, which promotes 1-2 lb weight loss per week and includes a negative energy balance of [phone] kcal/d;Understanding recommendations for meals to include 15-35% energy as protein, 25-35% energy from fat, 35-60% energy from carbohydrates, less than 200mg  of dietary cholesterol, 20-35 gm of total fiber daily;Understanding of distribution of calorie intake throughout the day with the consumption of 4-5 meals/snacks    Diabetes Yes    Intervention Provide education about signs/symptoms and action to take for hypo/hyperglycemia.;Provide education about proper nutrition, including hydration, and aerobic/resistive exercise prescription along with prescribed medications to achieve blood glucose in normal ranges: Fasting glucose 65-99 mg/dL    Expected Outcomes Short Term: Participant verbalizes understanding of the  signs/symptoms and immediate care of hyper/hypoglycemia, proper foot care and importance of medication, aerobic/resistive exercise and nutrition plan for blood glucose control.;Long Term: Attainment of HbA1C < 7%.    Hypertension Yes    Intervention Provide education on lifestyle modifcations including regular physical activity/exercise, weight management, moderate sodium restriction and increased consumption of fresh fruit, vegetables, and low fat dairy, alcohol moderation, and smoking cessation.;Monitor prescription use compliance.    Expected Outcomes Short Term: Continued assessment and intervention until BP is < 140/14mm HG in hypertensive participants. < 130/33mm HG in hypertensive participants with diabetes, heart failure or chronic kidney disease.;Long Term: Maintenance of blood pressure at goal levels.    Lipids Yes    Intervention Provide education and support for participant on nutrition & aerobic/resistive exercise along with prescribed medications to achieve LDL 70mg , HDL >40mg .    Expected Outcomes Long Term: Cholesterol controlled with medications as prescribed, with individualized exercise RX and with personalized nutrition plan. Value goals: LDL < 70mg , HDL > 40 mg.;Short Term: Participant states understanding of desired cholesterol values and is compliant with medications prescribed. Participant is following exercise prescription and nutrition guidelines.  Education:Diabetes - Individual verbal and written instruction to review signs/symptoms of diabetes, desired ranges of glucose level fasting, after meals and with exercise. Acknowledge that pre and post exercise glucose checks will be done for 3 sessions at entry of program. Flowsheet Row Cardiac Rehab from 05/11/2024 in [facility] Cardiac and Pulmonary Rehab  Date 03/02/24  Educator [facility]  Instruction Review Code 1- Verbalizes Understanding    Core Components/Risk Factors/Patient Goals Review:   Goals and Risk Factor Review      Row Name 04/18/24 1400 05/04/24 1350           Core Components/Risk Factors/Patient Goals Review   Personal Goals Review Hypertension Hypertension      Review Aliya's husband checks her BP twice a day, she reports it matches closely with the reading taken here at rehab. Malillany checks her BP twice a day, her husband helps her make sure she is sticking to this goal      Expected Outcomes STG: Continue to check BP at home. LTG: Manage risk factors independently STG: Continue to check BP at home. LTG: Manage risk factors independently         Core Components/Risk Factors/Patient Goals at Discharge (Final Review):   Goals and Risk Factor Review - 05/04/24 1350       Core Components/Risk Factors/Patient Goals Review   Personal Goals Review Hypertension    Review Catelyn checks her BP twice a day, her husband helps her make sure she is sticking to this goal    Expected Outcomes STG: Continue to check BP at home. LTG: Manage risk factors independently          ITP Comments:  ITP Comments     Row Name 03/01/24 1429 03/02/24 1140 03/07/24 1417 03/23/24 1157 04/20/24 0845   ITP Comments Initial phone call completed. Diagnosis can be found in [facility] 12/25/2023. EP Orientation scheduled for 03/02/2024 @ 10:00. Completed and gym orientation for cardiac rehab. Initial ITP created and sent for review to Dr. Oneil Pinal, Medical Director. First full day of exercise!  Patient was oriented to gym and equipment including functions, settings, policies, and procedures.  Patient's individual exercise prescription and treatment plan were reviewed.  All starting workloads were established based on the results of the 6 minute walk test done at initial orientation visit.  The plan for exercise progression was also introduced and progression will be customized based on patient's performance and goals. 30 Day review completed. Medical Director ITP review done, changes made as directed, and signed approval by  Medical Director. 30 Day review completed. Medical Director ITP review done, changes made as directed, and signed approval by Medical Director.    Row Name 05/18/24 0925           ITP Comments 30 Day review completed. Medical Director ITP review done, changes made as directed, and signed approval by Medical Director.          Comments: 30 day review

## 2024-05-19 ENCOUNTER — Encounter

## 2024-05-25 ENCOUNTER — Encounter

## 2024-05-26 ENCOUNTER — Encounter

## 2024-05-30 ENCOUNTER — Encounter: Attending: Cardiology | Admitting: Emergency Medicine

## 2024-05-30 DIAGNOSIS — Z951 Presence of aortocoronary bypass graft: Secondary | ICD-10-CM | POA: Diagnosis present

## 2024-05-30 NOTE — Progress Notes (Signed)
 Daily Session Note  Patient Details  Name: Linda Tucker MRN: [id number] Date of Birth: 04-21-1951 Referring Provider:   Flowsheet Row Cardiac Rehab from 03/02/2024 in [facility] Cardiac and Pulmonary Rehab  Referring Provider Dr. Toni Cool    Encounter Date: 05/30/2024  Check In:  Session Check In - 05/30/24 1351       Check-In   Supervising physician immediately available to respond to emergencies See telemetry face sheet for immediately available ER MD    Location [facility]-Cardiac & Pulmonary Rehab    Staff Present Maxon Conetta BS, Exercise Physiologist;Rhianon Zabawa RN,BSN;Noah Tickle, BS, Exercise Physiologist;Jason Elnor RDN,LDN;Margaret Best, MS, Exercise Physiologist    Virtual Visit No    Medication changes reported     No    Fall or balance concerns reported    No    Tobacco Cessation No Change    Warm-up and Cool-down Performed on first and last piece of equipment    Resistance Training Performed Yes    VAD Patient? No    PAD/SET Patient? No      Pain Assessment   Currently in Pain? No/denies             Social History   Tobacco Use  Smoking Status Never  Smokeless Tobacco Never    Goals Met:  Independence with exercise equipment Exercise tolerated well No report of concerns or symptoms today Strength training completed today  Goals Unmet:  Not Applicable  Comments: Pt able to follow exercise prescription today without complaint.  Will continue to monitor for progression.    Dr. Oneil Pinal is Medical Director for [facility] Cardiac Rehabilitation.  Dr. Fuad Aleskerov is Medical Director for [facility] Pulmonary Rehabilitation.

## 2024-06-01 ENCOUNTER — Encounter

## 2024-06-01 DIAGNOSIS — Z951 Presence of aortocoronary bypass graft: Secondary | ICD-10-CM | POA: Diagnosis not present

## 2024-06-01 NOTE — Progress Notes (Signed)
 Daily Session Note  Patient Details  Name: Linda Tucker MRN: [id number] Date of Birth: 1951-05-24 Referring Provider:   Flowsheet Row Cardiac Rehab from 03/02/2024 in [facility] Cardiac and Pulmonary Rehab  Referring Provider Dr. Toni Cool    Encounter Date: 06/01/2024  Check In:  Session Check In - 06/01/24 1434       Check-In   Supervising physician immediately available to respond to emergencies See telemetry face sheet for immediately available ER MD    Location [facility]-Cardiac & Pulmonary Rehab    Staff Present Burnard Davenport RN,BSN,MPA;Laura Cates RN,BSN;Shantese Raven Dyane BS, ACSM CEP, Exercise Physiologist;Noah Tickle, BS, Exercise Physiologist    Virtual Visit No    Medication changes reported     No    Fall or balance concerns reported    No    Tobacco Cessation No Change    Warm-up and Cool-down Performed on first and last piece of equipment    Resistance Training Performed Yes    VAD Patient? No    PAD/SET Patient? No      Pain Assessment   Currently in Pain? No/denies             Social History   Tobacco Use  Smoking Status Never  Smokeless Tobacco Never    Goals Met:  Independence with exercise equipment Exercise tolerated well No report of concerns or symptoms today Strength training completed today  Goals Unmet:  Not Applicable  Comments: Pt able to follow exercise prescription today without complaint.  Will continue to monitor for progression.    Dr. Oneil Pinal is Medical Director for [facility] Cardiac Rehabilitation.  Dr. Fuad Aleskerov is Medical Director for [facility] Pulmonary Rehabilitation.

## 2024-06-02 ENCOUNTER — Encounter: Admitting: Emergency Medicine

## 2024-06-02 DIAGNOSIS — Z951 Presence of aortocoronary bypass graft: Secondary | ICD-10-CM

## 2024-06-02 NOTE — Progress Notes (Signed)
 Discharge Summary   CARLEN REBUCK  DOB 17-Mar-1951   Rock graduated today from  rehab with 35 sessions completed.  Details of the patient's exercise prescription and what She needs to do in order to continue the prescription and progress were discussed with patient.  Patient was given a copy of prescription and goals.  Patient verbalized understanding. Keya plans to continue to exercise by using her home elliptical and possibly joining the pool.   6 Minute Walk     Row Name 03/02/24 1141 05/16/24 1432       6 Minute Walk   Phase Initial Discharge    Distance 455 feet 460 feet    Distance % Change -- 1.1 %    Distance Feet Change -- 5 ft    Walk Time 4.22 minutes 3.53 minutes    # of Rest Breaks 3 3    MPH 1.18 1.48    METS 1 0.82    RPE 13 15    Perceived Dyspnea  1 2    VO2 Peak 2.7 2.88    Symptoms Yes (comment) Yes (comment)    Comments Hip pain bilateral, lower back discomfort Dizziness    Resting HR 82 bpm 80 bpm    Resting BP 122/60 138/66    Resting Oxygen Saturation  97 % 98 %    Exercise Oxygen Saturation  during 6 min walk 99 % 93 %    Max Ex. HR 96 bpm 90 bpm    Max Ex. BP 144/62 160/66    2 Minute Post BP 130/62 120/60

## 2024-06-02 NOTE — Progress Notes (Signed)
 Cardiac Individual Treatment Plan  Patient Details  Name: CAELEN HIGINBOTHAM MRN: [id number] Date of Birth: 1950-11-10 Referring Provider:   Flowsheet Row Cardiac Rehab from 03/02/2024 in [facility] Cardiac and Pulmonary Rehab  Referring Provider Dr. Toni Cool    Initial Encounter Date:  Flowsheet Row Cardiac Rehab from 03/02/2024 in [facility] Cardiac and Pulmonary Rehab  Date 03/02/24    Visit Diagnosis: S/P CABG x 4  Patient's Home Medications on Admission:  Current Outpatient Medications:    apixaban (ELIQUIS) 5 MG TABS tablet, Take 5 mg by mouth 2 (two) times daily., Disp: , Rfl:    atorvastatin (LIPITOR) 40 MG tablet, Take 40 mg by mouth daily., Disp: , Rfl:    benzonatate  (TESSALON ) 200 MG capsule, Take 1 capsule (200 mg total) by mouth 3 (three) times daily as needed for cough. (Patient not taking: Reported on 03/01/2024), Disp: 30 capsule, Rfl: 0   Chromium 200 MCG CAPS, Take 200 mcg by mouth daily., Disp: , Rfl:    clopidogrel (PLAVIX) 75 MG tablet, Take 75 mg by mouth daily., Disp: , Rfl:    co-enzyme Q-10 30 MG capsule, Take by mouth. (Patient not taking: Reported on 03/01/2024), Disp: , Rfl:    cyanocobalamin (VITAMIN B12) 1000 MCG tablet, Take 1,000 mcg by mouth daily., Disp: , Rfl:    diltiazem (CARDIZEM CD) 180 MG 24 hr capsule, Take 120 mg by mouth daily., Disp: , Rfl:    fexofenadine (ALLEGRA ODT) 30 MG disintegrating tablet, Take 30 mg by mouth daily., Disp: , Rfl:    fluticasone  (FLONASE ) 50 MCG/ACT nasal spray, Place 2 sprays into both nostrils daily. (Patient not taking: Reported on 03/01/2024), Disp: 16 g, Rfl: 0   gabapentin (NEURONTIN) 300 MG capsule, Take 900 mg by mouth 2 (two) times daily., Disp: , Rfl:    hydrochlorothiazide (HYDRODIURIL) 25 MG tablet, Take 25 mg by mouth daily. (Patient not taking: Reported on 03/01/2024), Disp: , Rfl:    insulin glargine (LANTUS) 100 UNIT/ML injection, Inject 60 Units into the skin daily., Disp: , Rfl:    insulin regular (NOVOLIN R) 100  units/mL injection, Inject into the skin 3 (three) times daily before meals., Disp: , Rfl:    lisinopril (ZESTRIL) 20 MG tablet, Take 10 mg by mouth daily., Disp: , Rfl:    Magnesium Glycinate 100 MG CAPS, Take 300 mg by mouth daily., Disp: , Rfl:    Melatonin 5 MG CAPS, Take by mouth., Disp: , Rfl:    metFORMIN (GLUCOPHAGE) 850 MG tablet, Take 850 mg by mouth 2 (two) times daily with a meal., Disp: , Rfl:    metoprolol succinate (TOPROL-XL) 25 MG 24 hr tablet, Take 25 mg by mouth daily., Disp: , Rfl:    Multiple Vitamin (MULTI-VITAMIN) tablet, Take 1 tablet by mouth daily., Disp: , Rfl:    simvastatin (ZOCOR) 40 MG tablet, Take 40 mg by mouth daily. (Patient not taking: Reported on 03/01/2024), Disp: , Rfl:    Vitamin E 45 MG (100 UNIT) CAPS, Take by mouth. (Patient not taking: Reported on 03/01/2024), Disp: , Rfl:   Past Medical History: Past Medical History:  Diagnosis Date   Chronic back pain    Chronic hip pain    High cholesterol    Hypertension    Neuropathy    Type 2 diabetes mellitus (HCC)     Tobacco Use: Social History   Tobacco Use  Smoking Status Never  Smokeless Tobacco Never    Labs: Review Flowsheet  No data to display           Exercise Target Goals: Exercise Program Goal: Individual exercise prescription set using results from initial 6 min walk test and THRR while considering  patient's activity barriers and safety.   Exercise Prescription Goal: Initial exercise prescription builds to 30-45 minutes a day of aerobic activity, 2-3 days per week.  Home exercise guidelines will be given to patient during program as part of exercise prescription that the participant will acknowledge.   Education: Aerobic Exercise: - Group verbal and visual presentation on the components of exercise prescription. Introduces F.I.T.T principle from ACSM for exercise prescriptions.  Reviews F.I.T.T. principles of aerobic exercise including progression. Written material  provided at class time.   Education: Resistance Exercise: - Group verbal and visual presentation on the components of exercise prescription. Introduces F.I.T.T principle from ACSM for exercise prescriptions  Reviews F.I.T.T. principles of resistance exercise including progression. Written material provided at class time. Flowsheet Row Cardiac Rehab from 05/11/2024 in [facility] Cardiac and Pulmonary Rehab  Date 04/20/24  Educator [facility]  Instruction Review Code 1- [employer] Understanding     Education: Exercise & Equipment Safety: - Individual verbal instruction and demonstration of equipment use and safety with use of the equipment. Flowsheet Row Cardiac Rehab from 05/11/2024 in [facility] Cardiac and Pulmonary Rehab  Date 03/02/24  Educator [facility]  Instruction Review Code 1- Verbalizes Understanding    Education: Exercise Physiology & General Exercise Guidelines: - Group verbal and written instruction with models to review the exercise physiology of the cardiovascular system and associated critical values. Provides general exercise guidelines with specific guidelines to those with heart or lung disease. Written material provided at class time. Flowsheet Row Cardiac Rehab from 05/11/2024 in [facility] Cardiac and Pulmonary Rehab  Date 04/13/24  Educator [facility]  Instruction Review Code 1- [employer] Understanding    Education: Flexibility, Balance, Mind/Body Relaxation: - Group verbal and visual presentation with interactive activity on the components of exercise prescription. Introduces F.I.T.T principle from ACSM for exercise prescriptions. Reviews F.I.T.T. principles of flexibility and balance exercise training including progression. Also discusses the mind body connection.  Reviews various relaxation techniques to help reduce and manage stress (i.e. Deep breathing, progressive muscle relaxation, and visualization). Balance handout provided to take home. Written material provided at class time. Flowsheet Row Cardiac  Rehab from 05/11/2024 in [facility] Cardiac and Pulmonary Rehab  Date 04/20/24  Educator [facility]  Instruction Review Code 1- Verbalizes Understanding    Activity Barriers & Risk Stratification:  Activity Barriers & Cardiac Risk Stratification - 03/01/24 1453       Activity Barriers & Cardiac Risk Stratification   Activity Barriers History of Falls;Assistive Device    Cardiac Risk Stratification High          6 Minute Walk:  6 Minute Walk     Row Name 03/02/24 1141 05/16/24 1432       6 Minute Walk   Phase Initial Discharge    Distance 455 feet 460 feet    Distance % Change -- 1.1 %    Distance Feet Change -- 5 ft    Walk Time 4.22 minutes 3.53 minutes    # of Rest Breaks 3 3    MPH 1.18 1.48    METS 1 0.82    RPE 13 15    Perceived Dyspnea  1 2    VO2 Peak 2.7 2.88    Symptoms Yes (comment) Yes (comment)    Comments Hip pain bilateral, lower back  discomfort Dizziness    Resting HR 82 bpm 80 bpm    Resting BP 122/60 138/66    Resting Oxygen Saturation  97 % 98 %    Exercise Oxygen Saturation  during 6 min walk 99 % 93 %    Max Ex. HR 96 bpm 90 bpm    Max Ex. BP 144/62 160/66    2 Minute Post BP 130/62 120/60       Oxygen Initial Assessment:   Oxygen Re-Evaluation:   Oxygen Discharge (Final Oxygen Re-Evaluation):   Initial Exercise Prescription:  Initial Exercise Prescription - 03/02/24 1300       Date of Initial Exercise RX and Referring Provider   Date 03/02/24    Referring Provider Dr. Toni Cool      Oxygen   Maintain Oxygen Saturation 88% or higher      Recumbant Bike   Level 1    RPM 50    Watts 25    Minutes 15    METs 2      NuStep   Level 1    SPM 80    Minutes 15    METs 2      Biostep-RELP   Level 1    SPM 50    Minutes 15    METs 2      Track   Laps 16    Minutes 15    METs 2      Intensity   THRR 40-80% of Max Heartrate 107-133    Ratings of Perceived Exertion 11-13    Perceived Dyspnea 0-4      Progression    Progression Continue to progress workloads to maintain intensity without signs/symptoms of physical distress.      Resistance Training   Training Prescription Yes    Weight 2lb    Reps 10-15          Perform Capillary Blood Glucose checks as needed.  Exercise Prescription Changes:   Exercise Prescription Changes     Row Name 03/02/24 1300 03/23/24 1300 04/07/24 1100 04/19/24 1500 04/20/24 1400     Response to Exercise   Blood Pressure (Admit) 122/60 144/68 122/68 138/76 --   Blood Pressure (Exercise) 144/62 160/78 156/86 144/70 --   Blood Pressure (Exit) 130/62 126/62 132/72 150/60 --   Heart Rate (Admit) 82 bpm 87 bpm 88 bpm 87 bpm --   Heart Rate (Exercise) 96 bpm 102 bpm 101 bpm 108 bpm --   Heart Rate (Exit) 86 bpm 85 bpm 87 bpm 86 bpm --   Oxygen Saturation (Admit) 97 % -- -- -- --   Oxygen Saturation (Exercise) 99 % -- -- -- --   Oxygen Saturation (Exit) 99 % -- -- -- --   Rating of Perceived Exertion (Exercise) 13 15 15 15  --   Perceived Dyspnea (Exercise) 1 0 -- -- --   Symptoms Bilateral hip pain, back discomfort shin pain none none --   Comments results first 2 weeks of exercise -- -- --   Duration -- Progress to 30 minutes of  aerobic without signs/symptoms of physical distress Progress to 30 minutes of  aerobic without signs/symptoms of physical distress Progress to 30 minutes of  aerobic without signs/symptoms of physical distress Progress to 30 minutes of  aerobic without signs/symptoms of physical distress   Intensity -- THRR unchanged THRR unchanged THRR unchanged THRR unchanged     Progression   Progression -- Continue to progress workloads to maintain intensity without signs/symptoms of  physical distress. Continue to progress workloads to maintain intensity without signs/symptoms of physical distress. Continue to progress workloads to maintain intensity without signs/symptoms of physical distress. Continue to progress workloads to maintain intensity without  signs/symptoms of physical distress.   Average METs -- 1.64 1.53 1.76 1.76     Resistance Training   Training Prescription -- Yes Yes Yes Yes   Weight -- 2lb 2lb 2lb 2lb   Reps -- 10-15 10-15 10-15 10-15     Interval Training   Interval Training -- No No No No     NuStep   Level -- 1 1 -- --   Minutes -- 15 15 -- --   METs -- 2.1 1.7 -- --     T5 Nustep   Level -- 1 -- -- --   Minutes -- 15 -- -- --   METs -- 1.8 -- -- --     Biostep-RELP   Level -- 1 -- -- --   Minutes -- 15 -- -- --   METs -- 2 -- -- --     Track   Laps -- 10 10 18 18    Minutes -- 15 15 30 30    METs -- 1.54 1.54 1.98 1.98     Home Exercise Plan   Plans to continue exercise at -- -- -- -- Home (comment)  use elliptical type machine at home, look into pool membership   Frequency -- -- -- -- Add 2 additional days to program exercise sessions.   Initial Home Exercises Provided -- -- -- -- 04/20/24     Oxygen   Maintain Oxygen Saturation -- 88% or higher 88% or higher 88% or higher 88% or higher    Row Name 05/04/24 0900 05/18/24 1400 06/01/24 1400         Response to Exercise   Blood Pressure (Admit) 138/60 142/76 138/66     Blood Pressure (Exit) 128/68 108/62 138/64     Heart Rate (Admit) 86 bpm 69 bpm 80 bpm     Heart Rate (Exercise) 100 bpm 102 bpm 80 bpm     Heart Rate (Exit) 80 bpm 73 bpm 79 bpm     Oxygen Saturation (Admit) -- -- 98 %     Oxygen Saturation (Exercise) -- -- 91 %     Oxygen Saturation (Exit) -- -- 99 %     Rating of Perceived Exertion (Exercise) 15 17 15      Symptoms none none none     Duration Progress to 30 minutes of  aerobic without signs/symptoms of physical distress Progress to 30 minutes of  aerobic without signs/symptoms of physical distress Progress to 30 minutes of  aerobic without signs/symptoms of physical distress     Intensity THRR unchanged THRR unchanged THRR unchanged       Progression   Progression Continue to progress workloads to maintain intensity  without signs/symptoms of physical distress. Continue to progress workloads to maintain intensity without signs/symptoms of physical distress. Continue to progress workloads to maintain intensity without signs/symptoms of physical distress.     Average METs 1.8 1.63 1.54       Resistance Training   Training Prescription Yes Yes Yes     Weight 2lb 2lb 2lb     Reps 10-15 10-15 10-15       Interval Training   Interval Training No No No       Arm Ergometer   Level -- 1 --     Minutes -- 10 --  METs -- 1 --       Track   Laps 20 30 10      Minutes 30 30 30      METs 2.09 2.63 1.54       Home Exercise Plan   Plans to continue exercise at Home (comment)  use elliptical type machine at home, look into pool membership Home (comment)  use elliptical type machine at home, look into pool membership Home (comment)  use elliptical type machine at home, look into pool membership     Frequency Add 2 additional days to program exercise sessions. Add 2 additional days to program exercise sessions. Add 2 additional days to program exercise sessions.     Initial Home Exercises Provided 04/20/24 04/20/24 04/20/24       Oxygen   Maintain Oxygen Saturation 88% or higher 88% or higher 88% or higher        Exercise Comments:   Exercise Comments     Row Name 03/07/24 1418           Exercise Comments First full day of exercise!  Patient was oriented to gym and equipment including functions, settings, policies, and procedures.  Patient's individual exercise prescription and treatment plan were reviewed.  All starting workloads were established based on the results of the 6 minute walk test done at initial orientation visit.  The plan for exercise progression was also introduced and progression will be customized based on patient's performance and goals.          Exercise Goals and Review:   Exercise Goals     Row Name 03/02/24 1309             Exercise Goals   Increase Physical Activity  Yes       Intervention Provide advice, education, support and counseling about physical activity/exercise needs.;Develop an individualized exercise prescription for aerobic and resistive training based on initial evaluation findings, risk stratification, comorbidities and participant's personal goals.       Expected Outcomes Short Term: Attend rehab on a regular basis to increase amount of physical activity.;Long Term: Add in home exercise to make exercise part of routine and to increase amount of physical activity.;Long Term: Exercising regularly at least 3-5 days a week.       Increase Strength and Stamina Yes       Intervention Provide advice, education, support and counseling about physical activity/exercise needs.;Develop an individualized exercise prescription for aerobic and resistive training based on initial evaluation findings, risk stratification, comorbidities and participant's personal goals.       Expected Outcomes Long Term: Improve cardiorespiratory fitness, muscular endurance and strength as measured by increased METs and functional capacity ( );Short Term: Perform resistance training exercises routinely during rehab and add in resistance training at home;Short Term: Increase workloads from initial exercise prescription for resistance, speed, and METs.       Able to understand and use rate of perceived exertion (RPE) scale Yes       Intervention Provide education and explanation on how to use RPE scale       Expected Outcomes Short Term: Able to use RPE daily in rehab to express subjective intensity level;Long Term:  Able to use RPE to guide intensity level when exercising independently       Able to understand and use Dyspnea scale Yes       Intervention Provide education and explanation on how to use Dyspnea scale       Expected Outcomes Short Term: Able to  use Dyspnea scale daily in rehab to express subjective sense of shortness of breath during exertion;Long Term: Able to use  Dyspnea scale to guide intensity level when exercising independently       Knowledge and understanding of Target Heart Rate Range (THRR) Yes       Intervention Provide education and explanation of THRR including how the numbers were predicted and where they are located for reference       Expected Outcomes Short Term: Able to state/look up THRR;Short Term: Able to use daily as guideline for intensity in rehab;Long Term: Able to use THRR to govern intensity when exercising independently       Able to check pulse independently Yes       Intervention Provide education and demonstration on how to check pulse in carotid and radial arteries.;Review the importance of being able to check your own pulse for safety during independent exercise       Expected Outcomes Short Term: Able to explain why pulse checking is important during independent exercise;Long Term: Able to check pulse independently and accurately       Understanding of Exercise Prescription Yes       Intervention Provide education, explanation, and written materials on patient's individual exercise prescription       Expected Outcomes Long Term: Able to explain home exercise prescription to exercise independently;Short Term: Able to explain program exercise prescription          Exercise Goals Re-Evaluation :  Exercise Goals Re-Evaluation     Row Name 03/07/24 1420 03/23/24 1355 04/07/24 1104 04/18/24 1354 04/19/24 1502     Exercise Goal Re-Evaluation   Exercise Goals Review Increase Physical Activity;Able to understand and use rate of perceived exertion (RPE) scale;Knowledge and understanding of Target Heart Rate Range (THRR);Understanding of Exercise Prescription;Increase Strength and Stamina;Able to check pulse independently Increase Physical Activity;Increase Strength and Stamina;Understanding of Exercise Prescription Increase Physical Activity;Increase Strength and Stamina;Understanding of Exercise Prescription Increase Physical  Activity;Increase Strength and Stamina;Understanding of Exercise Prescription Increase Physical Activity;Increase Strength and Stamina;Understanding of Exercise Prescription   Comments Reviewed RPE and dyspnea scale, THR and program prescription with pt today.  Pt voiced understanding and was given a copy of goals to take home. Angelice is off to a great start in the program. She has been able to use both the T4 and T5 nustep at level 1. She was also able to walk 10 laps on the track. We will continue to monitor her progress in the program. Saachi is doing well in rehab. She maintained level 1 on the T4 nustep. She also maintained walking 10 laps on the track. We will continue to monitor her progress in the program. Nickia is doing well here at rehab, she is not doing much at home but plans to do some water aerobics once she graduates from rehab. Elliette is doing well in rehab. She increased her laps on the track to 18 laps in 30 minutes with breaks. We will continue to monitor her progress in the program.   Expected Outcomes Short: Use RPE daily to regulate intensity.  Long: Follow program prescription in THR. Short: Continue to follow exercise prescription. Long: Continue exercise to improve strength and stamina. Short: Try level 2 on the T4 nustep. Long: Continue exercise to improve strength and stamina. STG: increase workload here at rehab. LTG: Continue to exercise to strength and stamina Short: Continue to progressively increase laps on the track. Long: Continue exercise to improve strength and stamina.  Row Name 04/20/24 1429 05/04/24 0959 05/18/24 1406 06/01/24 1409       Exercise Goal Re-Evaluation   Exercise Goals Review Increase Physical Activity;Able to understand and use rate of perceived exertion (RPE) scale;Knowledge and understanding of Target Heart Rate Range (THRR);Understanding of Exercise Prescription;Increase Strength and Stamina;Able to understand and use Dyspnea scale;Able to check pulse  independently Increase Physical Activity;Increase Strength and Stamina;Understanding of Exercise Prescription Increase Physical Activity;Increase Strength and Stamina;Understanding of Exercise Prescription Increase Physical Activity;Increase Strength and Stamina;Understanding of Exercise Prescription    Comments Reviewed home exercise with pt today.  Pt plans to use an elliptical machine at home and look into joining the sports plex and using the pool for exercise.  Reviewed THR, pulse, RPE, sign and symptoms, pulse oximetery and when to call 911 or MD.  Also discussed weather considerations and indoor options.  Pt voiced understanding. Danette is doing well in rehab. She has been able to increase from 18 to 20 laps on the track in 30 minutes. We will conitnue to monitor her progress in the program. Dioselina continues to do well in rehab. She was able to increase from 20 to 30 laps on the track in 30 minutes. She was also able to add the arm ergometer to her exercise prescription at level 1. We will continue to monitor her progress in the program. Makinzie is doing well in rehab. She recently completed her post-6MWT and was able to increase by 58ft. She was also able to walk 10 feet on the track. We will continue to monitor her progress in the program.    Expected Outcomes Short: add 1-2 days a week of exericse at home on off days of rehab. Look into joining the sports plex near her house to start swimming. Long: maintian independent exercise routine upon graduation from cardiac rehab. Short: Continue to increase track laps. Long: Continue exercise to improve strength and stamina. Short: Continue to increase track laps. Long: Continue exercise to improve strength and stamina. Short: Continue to increase track laps. Long: Continue exercise to improve strength and stamina.       Discharge Exercise Prescription (Final Exercise Prescription Changes):  Exercise Prescription Changes - 06/01/24 1400       Response to  Exercise   Blood Pressure (Admit) 138/66    Blood Pressure (Exit) 138/64    Heart Rate (Admit) 80 bpm    Heart Rate (Exercise) 80 bpm    Heart Rate (Exit) 79 bpm    Oxygen Saturation (Admit) 98 %    Oxygen Saturation (Exercise) 91 %    Oxygen Saturation (Exit) 99 %    Rating of Perceived Exertion (Exercise) 15    Symptoms none    Duration Progress to 30 minutes of  aerobic without signs/symptoms of physical distress    Intensity THRR unchanged      Progression   Progression Continue to progress workloads to maintain intensity without signs/symptoms of physical distress.    Average METs 1.54      Resistance Training   Training Prescription Yes    Weight 2lb    Reps 10-15      Interval Training   Interval Training No      Track   Laps 10    Minutes 30    METs 1.54      Home Exercise Plan   Plans to continue exercise at Home (comment)   use elliptical type machine at home, look into pool membership   Frequency Add 2 additional days  to program exercise sessions.    Initial Home Exercises Provided 04/20/24      Oxygen   Maintain Oxygen Saturation 88% or higher          Nutrition:  Target Goals: Understanding of nutrition guidelines, daily intake of sodium 1500mg , cholesterol 200mg , calories 30% from fat and 7% or less from saturated fats, daily to have 5 or more servings of fruits and vegetables.  Education: Nutrition 1 -Group instruction provided by verbal, written material, interactive activities, discussions, models, and posters to present general guidelines for heart healthy nutrition including macronutrients, label reading, and promoting whole foods over processed counterparts. Education serves as Pensions consultant of discussion of heart healthy eating for all. Written material provided at class time. Flowsheet Row Cardiac Rehab from 05/11/2024 in [facility] Cardiac and Pulmonary Rehab  Date 05/04/24  Educator jg  Instruction Review Code 1- Verbalizes Understanding      Education: Nutrition 2 -Group instruction provided by verbal, written material, interactive activities, discussions, models, and posters to present general guidelines for heart healthy nutrition including sodium, cholesterol, and saturated fat. Providing guidance of habit forming to improve blood pressure, cholesterol, and body weight. Written material provided at class time. Flowsheet Row Cardiac Rehab from 05/11/2024 in [facility] Cardiac and Pulmonary Rehab  Date 05/11/24  Educator jg  Instruction Review Code 1- Verbalizes Understanding      Biometrics:  Pre Biometrics - 03/02/24 1310       Pre Biometrics   Height 5' 3.4 (1.61 m)    Weight 202 lb 12.8 oz (92 kg)    Waist Circumference 46.5 inches    Hip Circumference 41 inches    Waist to Hip Ratio 1.13 %    BMI (Calculated) 35.49    Single Leg Stand 0 seconds          Post Biometrics - 05/16/24 1436        Post  Biometrics   Height 5' 3.4 (1.61 m)    Waist Circumference 48.4 inches    Hip Circumference 50.7 inches    Waist to Hip Ratio 0.95 %    Single Leg Stand 0.5 seconds          Nutrition Therapy Plan and Nutrition Goals:   Nutrition Assessments:  MEDIFICTS Score Key: >=70 Need to make dietary changes  40-70 Heart Healthy Diet <= 40 Therapeutic Level Cholesterol Diet  Flowsheet Row Cardiac Rehab from 05/30/2024 in [facility] Cardiac and Pulmonary Rehab  Picture Your Plate Total Score on Discharge 64   Picture Your Plate Scores: <59 Unhealthy dietary pattern with much room for improvement. 41-50 Dietary pattern unlikely to meet recommendations for good health and room for improvement. 51-60 More healthful dietary pattern, with some room for improvement.  >60 Healthy dietary pattern, although there may be some specific behaviors that could be improved.    Nutrition Goals Re-Evaluation:  Nutrition Goals Re-Evaluation     Row Name 04/18/24 1358 05/04/24 1349           Goals   Comment Taleia reports  she has been drinking more water lately. She is eating better since starting rehab. Her husband is a retired [employer] fundraiser and makes sure she is eating well. Karinne continues to eat well, her husband helping her stay on track with heart health.      Expected Outcome STG: Drink 64oz of water and eat 3 times per day. LTG: Follow a heart health diet STG: Drink 64oz of water and eat 3 times per day. LTG: Follow a  heart health diet         Nutrition Goals Discharge (Final Nutrition Goals Re-Evaluation):  Nutrition Goals Re-Evaluation - 05/04/24 1349       Goals   Comment Amoura continues to eat well, her husband helping her stay on track with heart health.    Expected Outcome STG: Drink 64oz of water and eat 3 times per day. LTG: Follow a heart health diet          Psychosocial: Target Goals: Acknowledge presence or absence of significant depression and/or stress, maximize coping skills, provide positive support system. Participant is able to verbalize types and ability to use techniques and skills needed for reducing stress and depression.   Education: Stress, Anxiety, and Depression - Group verbal and visual presentation to define topics covered.  Reviews how body is impacted by stress, anxiety, and depression.  Also discusses healthy ways to reduce stress and to treat/manage anxiety and depression. Written material provided at class time.   Education: Sleep Hygiene -Provides group verbal and written instruction about how sleep can affect your health.  Define sleep hygiene, discuss sleep cycles and impact of sleep habits. Review good sleep hygiene tips.   Initial Review & Psychosocial Screening:  Initial Psych Review & Screening - 03/01/24 1432       Initial Review   Current issues with Current Sleep Concerns   states she has to get up every 2-3 hours to use the restroom     Family Dynamics   Good Support System? Yes   husband and children/their families     Barriers   Psychosocial barriers to  participate in program There are no identifiable barriers or psychosocial needs.;The patient should benefit from training in stress management and relaxation.      Screening Interventions   Interventions Encouraged to exercise          Quality of Life Scores:   Quality of Life - 05/30/24 1354       Quality of Life   Select Quality of Life      Quality of Life Scores   Health/Function Post 14.5 %    Socioeconomic Post 19.69 %    Psych/Spiritual Post 25.43 %    Family Post 24.5 %    GLOBAL Post 19.3 %         Scores of 19 and below usually indicate a poorer quality of life in these areas.  A difference of  2-3 points is a clinically meaningful difference.  A difference of 2-3 points in the total score of the Quality of Life Index has been associated with significant improvement in overall quality of life, self-image, physical symptoms, and general health in studies assessing change in quality of life.  PHQ-9: Review Flowsheet       05/30/2024 05/04/2024 04/18/2024 03/02/2024  Depression screen PHQ 2/9  Decreased Interest 0 1 1 1   Down, Depressed, Hopeless 0 0 0 0  PHQ - 2 Score 0 1 1 1   Altered sleeping 1 0 1 1  Tired, decreased energy 1 1 2 2   Change in appetite 0 1 1 2   Feeling bad or failure about yourself  0 0 0 0  Trouble concentrating 0 0 0 0  Moving slowly or fidgety/restless 0 0 0 0  Suicidal thoughts 0 0 0 0  PHQ-9 Score 2 3 5 6   Difficult doing work/chores Somewhat difficult Somewhat difficult Somewhat difficult Somewhat difficult   Interpretation of Total Score  Total Score Depression Severity:  1-4 =  Minimal depression, 5-9 = Mild depression, 10-14 = Moderate depression, 15-19 = Moderately severe depression, 20-27 = Severe depression   Psychosocial Evaluation and Intervention:  Psychosocial Evaluation - 03/01/24 1443       Psychosocial Evaluation & Interventions   Comments 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 She stated that her blood pressure is  well managed and her husband has been monitoring it twice daily since the doctor recently changed her BP medications. Patient has diabetes type 2 and takes insulin injections. Patient stated she wears a monitor on her arm to assist with blood sugar control and her last A1C was 7% of which she was pleased. Patient stated she has been working to lose weight and has lost some weight slowly and safely. Patient stated her sleep is often interrupted as she has to go to the restroom every 2-3 hours. Patient's oldest daughter and husband were involved in the initial call and patient stated she has a good support system of her family and does not note any issues with stress or depression. 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 She also uses a cane or walker, and wheelchair at times, to mobilize outside of the home. She states she prefers visual learning aids but is able to read.    Expected Outcomes ST: attend cardiac rehab for education and exercise; LT: develop and maintain positive self-care habits          Psychosocial Re-Evaluation:  Psychosocial Re-Evaluation     Row Name 04/18/24 1355 05/04/24 1347           Psychosocial Re-Evaluation   Current issues with Current Sleep Concerns --      Comments Mariesa reports she doesnt sleep well, wakes up in middle of night to use bathroom. Then she sometimes will struggle to go back to sleep. She met with a Dr today and got a sleeping pill. She denies any anxiety, stress or depression at this time. Berdella denies nay anxiety or depression, has good support from family. She was sleeping poorly, but was given sleep medication 2 weeks ago. She reports she sleeps so much better now.      Expected Outcomes STG: try sleeping medication, focus on good sleep. LTG: Achieve and maintain a positive outlook on health and daily life STG: Focus on good sleep. LTG: Achieve and maintain a positive outlook on health and daily life       Interventions Encouraged to attend Cardiac Rehabilitation for the exercise Encouraged to attend Cardiac Rehabilitation for the exercise      Continue Psychosocial Services  Follow up required by staff Follow up required by staff         Psychosocial Discharge (Final Psychosocial Re-Evaluation):  Psychosocial Re-Evaluation - 05/04/24 1347       Psychosocial Re-Evaluation   Comments Elain denies nay anxiety or depression, has good support from family. She was sleeping poorly, but was given sleep medication 2 weeks ago. She reports she sleeps so much better now.    Expected Outcomes STG: Focus on good sleep. LTG: Achieve and maintain a positive outlook on health and daily life    Interventions Encouraged to attend Cardiac Rehabilitation for the exercise    Continue Psychosocial Services  Follow up required by staff          Vocational Rehabilitation: Provide vocational rehab assistance to qualifying candidates.   Vocational Rehab Evaluation & Intervention:  Vocational Rehab - 03/01/24 1434  Initial Vocational Rehab Evaluation & Intervention   Assessment shows need for Vocational Rehabilitation No          Education: Education Goals: Education classes will be provided on a variety of topics geared toward better understanding of heart health and risk factor modification. Participant will state understanding/return demonstration of topics presented as noted by education test scores.  Learning Barriers/Preferences:  Learning Barriers/Preferences - 03/01/24 1431       Learning Barriers/Preferences   Learning Barriers Hearing   wears hearing aids   Learning Preferences Individual Instruction;Verbal Instruction;Video          General Cardiac Education Topics:  AED/CPR: - Group verbal and written instruction with the use of models to demonstrate the basic use of the AED with the basic ABC's of resuscitation.   Test and Procedures: - Group verbal and visual  presentation and models provide information about basic cardiac anatomy and function. Reviews the testing methods done to diagnose heart disease and the outcomes of the test results. Describes the treatment choices: Medical Management, Angioplasty, or Coronary Bypass Surgery for treating various heart conditions including Myocardial Infarction, Angina, Valve Disease, and Cardiac Arrhythmias. Written material provided at class time.   Medication Safety: - Group verbal and visual instruction to review commonly prescribed medications for heart and lung disease. Reviews the medication, class of the drug, and side effects. Includes the steps to properly store meds and maintain the prescription regimen. Written material provided at class time. Flowsheet Row Cardiac Rehab from 05/11/2024 in [facility] Cardiac and Pulmonary Rehab  Date 03/16/24  Educator sb  Instruction Review Code 1- Verbalizes Understanding    Intimacy: - Group verbal instruction through game format to discuss how heart and lung disease can affect sexual intimacy. Written material provided at class time.   Know Your Numbers and Heart Failure: - Group verbal and visual instruction to discuss disease risk factors for cardiac and pulmonary disease and treatment options.  Reviews associated critical values for Overweight/Obesity, Hypertension, Cholesterol, and Diabetes.  Discusses basics of heart failure: signs/symptoms and treatments.  Introduces Heart Failure Zone chart for action plan for heart failure. Written material provided at class time.   Infection Prevention: - Provides verbal and written material to individual with discussion of infection control including proper hand washing and proper equipment cleaning during exercise session. Flowsheet Row Cardiac Rehab from 05/11/2024 in [facility] Cardiac and Pulmonary Rehab  Date 03/02/24  Educator [facility]  Instruction Review Code 1- Verbalizes Understanding    Falls Prevention: - Provides verbal  and written material to individual with discussion of falls prevention and safety. Flowsheet Row Cardiac Rehab from 05/11/2024 in [facility] Cardiac and Pulmonary Rehab  Date 03/02/24  Educator [facility]  Instruction Review Code 1- Verbalizes Understanding    Other: -Provides group and verbal instruction on various topics (see comments)   Knowledge Questionnaire Score:  Knowledge Questionnaire Score - 05/30/24 1353       Knowledge Questionnaire Score   Post Score 20/26          Core Components/Risk Factors/Patient Goals at Admission:  Personal Goals and Risk Factors at Admission - 03/01/24 1430       Core Components/Risk Factors/Patient Goals on Admission    Weight Management Yes    Intervention Weight Management/Obesity: Establish reasonable short term and long term weight goals.;Weight Management: Provide education and appropriate resources to help participant work on and attain dietary goals.;Obesity: Provide education and appropriate resources to help participant work on and attain dietary goals.  Expected Outcomes Short Term: Continue to assess and modify interventions until short term weight is achieved;Long Term: Adherence to nutrition and physical activity/exercise program aimed toward attainment of established weight goal;Weight Loss: Understanding of general recommendations for a balanced deficit meal plan, which promotes 1-2 lb weight loss per week and includes a negative energy balance of [phone] kcal/d;Understanding recommendations for meals to include 15-35% energy as protein, 25-35% energy from fat, 35-60% energy from carbohydrates, less than 200mg  of dietary cholesterol, 20-35 gm of total fiber daily;Understanding of distribution of calorie intake throughout the day with the consumption of 4-5 meals/snacks    Diabetes Yes    Intervention Provide education about signs/symptoms and action to take for hypo/hyperglycemia.;Provide education about proper nutrition, including hydration,  and aerobic/resistive exercise prescription along with prescribed medications to achieve blood glucose in normal ranges: Fasting glucose 65-99 mg/dL    Expected Outcomes Short Term: Participant verbalizes understanding of the signs/symptoms and immediate care of hyper/hypoglycemia, proper foot care and importance of medication, aerobic/resistive exercise and nutrition plan for blood glucose control.;Long Term: Attainment of HbA1C < 7%.    Hypertension Yes    Intervention Provide education on lifestyle modifcations including regular physical activity/exercise, weight management, moderate sodium restriction and increased consumption of fresh fruit, vegetables, and low fat dairy, alcohol moderation, and smoking cessation.;Monitor prescription use compliance.    Expected Outcomes Short Term: Continued assessment and intervention until BP is < 140/32mm HG in hypertensive participants. < 130/21mm HG in hypertensive participants with diabetes, heart failure or chronic kidney disease.;Long Term: Maintenance of blood pressure at goal levels.    Lipids Yes    Intervention Provide education and support for participant on nutrition & aerobic/resistive exercise along with prescribed medications to achieve LDL 70mg , HDL >40mg .    Expected Outcomes Long Term: Cholesterol controlled with medications as prescribed, with individualized exercise RX and with personalized nutrition plan. Value goals: LDL < 70mg , HDL > 40 mg.;Short Term: Participant states understanding of desired cholesterol values and is compliant with medications prescribed. Participant is following exercise prescription and nutrition guidelines.          Education:Diabetes - Individual verbal and written instruction to review signs/symptoms of diabetes, desired ranges of glucose level fasting, after meals and with exercise. Acknowledge that pre and post exercise glucose checks will be done for 3 sessions at entry of program. Flowsheet Row Cardiac  Rehab from 05/11/2024 in [facility] Cardiac and Pulmonary Rehab  Date 03/02/24  Educator [facility]  Instruction Review Code 1- Verbalizes Understanding    Core Components/Risk Factors/Patient Goals Review:   Goals and Risk Factor Review     Row Name 04/18/24 1400 05/04/24 1350           Core Components/Risk Factors/Patient Goals Review   Personal Goals Review Hypertension Hypertension      Review Bhavya's husband checks her BP twice a day, she reports it matches closely with the reading taken here at rehab. Jahmia checks her BP twice a day, her husband helps her make sure she is sticking to this goal      Expected Outcomes STG: Continue to check BP at home. LTG: Manage risk factors independently STG: Continue to check BP at home. LTG: Manage risk factors independently         Core Components/Risk Factors/Patient Goals at Discharge (Final Review):   Goals and Risk Factor Review - 05/04/24 1350       Core Components/Risk Factors/Patient Goals Review   Personal Goals Review Hypertension    Review  Rhen checks her BP twice a day, her husband helps her make sure she is sticking to this goal    Expected Outcomes STG: Continue to check BP at home. LTG: Manage risk factors independently          ITP Comments:  ITP Comments     Row Name 03/01/24 1429 03/02/24 1140 03/07/24 1417 03/23/24 1157 04/20/24 0845   ITP Comments Initial phone call completed. Diagnosis can be found in [facility] 12/25/2023. EP Orientation scheduled for 03/02/2024 @ 10:00. Completed and gym orientation for cardiac rehab. Initial ITP created and sent for review to Dr. Oneil Pinal, Medical Director. First full day of exercise!  Patient was oriented to gym and equipment including functions, settings, policies, and procedures.  Patient's individual exercise prescription and treatment plan were reviewed.  All starting workloads were established based on the results of the 6 minute walk test done at initial orientation visit.  The plan  for exercise progression was also introduced and progression will be customized based on patient's performance and goals. 30 Day review completed. Medical Director ITP review done, changes made as directed, and signed approval by Medical Director. 30 Day review completed. Medical Director ITP review done, changes made as directed, and signed approval by Medical Director.    Row Name 05/18/24 0925           ITP Comments 30 Day review completed. Medical Director ITP review done, changes made as directed, and signed approval by Medical Director.          Comments: Discharge ITP

## 2024-06-02 NOTE — Progress Notes (Signed)
 Daily Session Note  Patient Details  Name: Linda Tucker MRN: [id number] Date of Birth: 17-Oct-1950 Referring Provider:   Flowsheet Row Cardiac Rehab from 03/02/2024 in [facility] Cardiac and Pulmonary Rehab  Referring Provider Dr. Toni Cool    Encounter Date: 06/02/2024  Check In:  Session Check In - 06/02/24 1445       Check-In   Supervising physician immediately available to respond to emergencies See telemetry face sheet for immediately available ER MD    Location [facility]-Cardiac & Pulmonary Rehab    Staff Present Devaughn Jaeger, BS, Exercise Physiologist;Margaret Best, MS, Exercise Physiologist;Joseph Rolinda RCP,RRT,BSRT;Shantoria Ellwood RN,BSN    Virtual Visit No    Medication changes reported     No    Fall or balance concerns reported    No    Tobacco Cessation No Change    Warm-up and Cool-down Performed on first and last piece of equipment    Resistance Training Performed Yes    VAD Patient? No    PAD/SET Patient? No      Pain Assessment   Currently in Pain? No/denies             Social History   Tobacco Use  Smoking Status Never  Smokeless Tobacco Never    Goals Met:  Independence with exercise equipment Exercise tolerated well No report of concerns or symptoms today Strength training completed today  Goals Unmet:  Not Applicable  Comments:  Ozzie graduated today from  rehab with 35 sessions completed.  Details of the patient's exercise prescription and what She needs to do in order to continue the prescription and progress were discussed with patient.  Patient was given a copy of prescription and goals.  Patient verbalized understanding. Linda Tucker plans to continue to exercise by using her home elliptical and possibly getting a pool membership.     Dr. Oneil Pinal is Medical Director for [facility] Cardiac Rehabilitation.  Dr. Fuad Aleskerov is Medical Director for [facility] Pulmonary Rehabilitation.

## 2024-06-06 ENCOUNTER — Ambulatory Visit

## 2024-08-11 ENCOUNTER — Ambulatory Visit: Attending: Internal Medicine

## 2024-08-11 DIAGNOSIS — R42 Dizziness and giddiness: Secondary | ICD-10-CM | POA: Diagnosis present
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
## 2024-08-16 ENCOUNTER — Ambulatory Visit

## 2024-08-16 DIAGNOSIS — R42 Dizziness and giddiness: Secondary | ICD-10-CM
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
## 2024-08-25 ENCOUNTER — Ambulatory Visit: Attending: Internal Medicine

## 2024-08-25 DIAGNOSIS — R42 Dizziness and giddiness: Secondary | ICD-10-CM | POA: Diagnosis present

## 2024-09-01 ENCOUNTER — Ambulatory Visit

## 2024-09-08 ENCOUNTER — Ambulatory Visit

## 2024-09-13 ENCOUNTER — Ambulatory Visit

## 2024-09-20 ENCOUNTER — Ambulatory Visit
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
## 2024-09-29 ENCOUNTER — Ambulatory Visit: Attending: Internal Medicine

## 2024-09-29 DIAGNOSIS — R42 Dizziness and giddiness: Secondary | ICD-10-CM | POA: Insufficient documentation

## 2024-10-06 ENCOUNTER — Ambulatory Visit

## 2024-10-06 DIAGNOSIS — R42 Dizziness and giddiness: Secondary | ICD-10-CM
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
## 2024-10-13 ENCOUNTER — Ambulatory Visit

## 2024-10-13 DIAGNOSIS — R42 Dizziness and giddiness: Secondary | ICD-10-CM
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
## 2024-10-19 ENCOUNTER — Ambulatory Visit

## 2024-10-19 DIAGNOSIS — R42 Dizziness and giddiness: Secondary | ICD-10-CM

## 2024-10-20 ENCOUNTER — Ambulatory Visit

## 2024-10-27 ENCOUNTER — Ambulatory Visit

## 2024-11-03 ENCOUNTER — Ambulatory Visit

## 2024-11-09 ENCOUNTER — Ambulatory Visit

## 2024-11-24 ENCOUNTER — Ambulatory Visit
